# Patient Record
Sex: Female | Born: 1995 | Race: White | Hispanic: No | Smoking: Former smoker
Health system: Southern US, Community
[De-identification: ages and names within clinical notes are randomized; demographics above are authoritative.]

## PROBLEM LIST (undated history)

## (undated) DIAGNOSIS — K2100 Gastro-esophageal reflux disease with esophagitis, without bleeding: Secondary | ICD-10-CM

## (undated) DIAGNOSIS — F99 Mental disorder, not otherwise specified: Secondary | ICD-10-CM

## (undated) DIAGNOSIS — T7422XA Child sexual abuse, confirmed, initial encounter: Secondary | ICD-10-CM

## (undated) DIAGNOSIS — Z3009 Encounter for other general counseling and advice on contraception: Principal | ICD-10-CM

## (undated) DIAGNOSIS — Z30017 Encounter for initial prescription of implantable subdermal contraceptive: Principal | ICD-10-CM

## (undated) DIAGNOSIS — M419 Scoliosis, unspecified: Secondary | ICD-10-CM

## (undated) HISTORY — DX: Gastro-esophageal reflux disease with esophagitis, without bleeding: K21.00

## (undated) HISTORY — DX: Child sexual abuse, confirmed, initial encounter: T74.22XA

## (undated) HISTORY — PX: WISDOM TOOTH EXTRACTION: SHX21

## (undated) HISTORY — DX: Encounter for other general counseling and advice on contraception: Z30.09

## (undated) HISTORY — DX: Scoliosis, unspecified: M41.9

## (undated) HISTORY — DX: Mental disorder, not otherwise specified: F99

## (undated) HISTORY — DX: Encounter for initial prescription of implantable subdermal contraceptive: Z30.017

---
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
## 2017-07-20 ENCOUNTER — Telehealth: Payer: Self-pay | Admitting: Family Medicine

## 2017-07-20 NOTE — Telephone Encounter (Signed)
Pt called stating that [facility] will only evaluate her for ADHD, but stated that they will not prescribe her any medication for it due to it not being their specialty. Please advise.

## 2017-07-20 NOTE — Telephone Encounter (Signed)
Pt is seriously depressed, as that improves with depr therapy it should help her focusing, she needs to definitely tackle this first. I will not rx her any mental health meds(including adhd meds) due to the seriousness of her issues and I have made that very clear to her. She needs to first get her depr under the control.

## 2017-07-20 NOTE — Telephone Encounter (Signed)
Please advise 

## 2017-07-21 ENCOUNTER — Encounter: Payer: Self-pay | Admitting: Advanced Practice Midwife

## 2017-07-21 NOTE — Telephone Encounter (Signed)
No. I will not do that until pt gets on depression therapy and gets rolling with meds, every time she fills out a phq9 she circles for numb 3 including self harm thoughts, see my prior notes
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
## 2017-07-21 NOTE — Telephone Encounter (Signed)
Left message to return call 

## 2017-07-21 NOTE — Telephone Encounter (Signed)
Patient advised that as her deprssion improves with depression therapy it should help her focusing, she needs to definitely tackle this first. Dr Brett CanalesSteve will not rx her any mental health meds(including adhd meds) due to the seriousness of her issues and he has made that very clear to her. She needs to first get her depression under the control. Patient verbalized understanding and stated her depression is better and she wants referral to someone-psych who will prescribe he ADHD meds

## 2017-07-26 NOTE — Telephone Encounter (Signed)
Pt needs long term and ongoing mental health care, I am sorry she didn't like dr Tenny Crawross and so therefore fired her, I am sorry she also doesn't like [facility] and therefor stopped going [facility] , there are vry few choices in our area for m h care. I will not rx her adhd/mental health meds because of her complexity. She needs to get back with [facility]. If she seriously worsens in the meantime, then she should go directly to the ER to be eval urgent;ly by the mental health team

## 2017-07-27 NOTE — Telephone Encounter (Signed)
I called and the number has been changed or disconnected according to the vm. I mailed a green card asking her to call our office.

## 2017-07-28 NOTE — Telephone Encounter (Signed)
Patient # disconnected or has been changed. Green card mailed.07/28/2017

## 2017-08-02 NOTE — Telephone Encounter (Signed)
Phone number we have on file is no longer in service we mailed the patient a card to return our call with no response

## 2017-08-02 NOTE — Telephone Encounter (Signed)
Ok if she ever calls that's our response

## 2017-08-10 ENCOUNTER — Other Ambulatory Visit (HOSPITAL_COMMUNITY): Payer: Self-pay | Admitting: Orthopedic Surgery

## 2017-08-10 DIAGNOSIS — M4120 Other idiopathic scoliosis, site unspecified: Secondary | ICD-10-CM

## 2017-08-10 DIAGNOSIS — M5432 Sciatica, left side: Secondary | ICD-10-CM

## 2017-08-10 DIAGNOSIS — M21372 Foot drop, left foot: Secondary | ICD-10-CM

## 2017-08-11 ENCOUNTER — Telehealth: Payer: Self-pay | Admitting: *Deleted

## 2017-08-11 NOTE — Telephone Encounter (Signed)
pts option to choose to hold off

## 2017-08-11 NOTE — Telephone Encounter (Signed)
See telephone note from 07/20/17. Pt received card in the mail and called back. Discussed message from 07/20/16 with pt and she states she is not worried about the medication now and she does not feel like she needs to be seen by mental health.

## 2017-08-16 ENCOUNTER — Ambulatory Visit (HOSPITAL_COMMUNITY)
Admission: RE | Admit: 2017-08-16 | Discharge: 2017-08-16 | Disposition: A | Discharge status: Home / Self Care | Payer: Medicaid Other | Source: Ambulatory Visit | Attending: Orthopedic Surgery | Admitting: Orthopedic Surgery

## 2017-08-16 DIAGNOSIS — M21372 Foot drop, left foot: Secondary | ICD-10-CM | POA: Diagnosis not present

## 2017-08-16 DIAGNOSIS — M5442 Lumbago with sciatica, left side: Secondary | ICD-10-CM | POA: Diagnosis not present

## 2017-08-16 DIAGNOSIS — R200 Anesthesia of skin: Secondary | ICD-10-CM | POA: Diagnosis not present

## 2017-08-16 DIAGNOSIS — M4686 Other specified inflammatory spondylopathies, lumbar region: Secondary | ICD-10-CM | POA: Diagnosis not present

## 2017-08-16 DIAGNOSIS — M4120 Other idiopathic scoliosis, site unspecified: Secondary | ICD-10-CM

## 2017-08-16 DIAGNOSIS — M4327 Fusion of spine, lumbosacral region: Secondary | ICD-10-CM | POA: Insufficient documentation

## 2017-08-16 DIAGNOSIS — M4186 Other forms of scoliosis, lumbar region: Secondary | ICD-10-CM | POA: Diagnosis not present

## 2017-08-16 DIAGNOSIS — M5432 Sciatica, left side: Secondary | ICD-10-CM

## 2018-05-02 ENCOUNTER — Ambulatory Visit (INDEPENDENT_AMBULATORY_CARE_PROVIDER_SITE_OTHER): Payer: Medicaid Other | Admitting: Family Medicine

## 2018-05-02 ENCOUNTER — Encounter: Payer: Self-pay | Admitting: Family Medicine

## 2018-05-02 VITALS — BP 118/71 | HR 82 | Ht 65.0 in | Wt 140.6 lb

## 2018-05-02 DIAGNOSIS — A749 Chlamydial infection, unspecified: Secondary | ICD-10-CM | POA: Diagnosis not present

## 2018-05-02 DIAGNOSIS — Z0000 Encounter for general adult medical examination without abnormal findings: Secondary | ICD-10-CM

## 2018-05-03 ENCOUNTER — Encounter: Payer: Self-pay | Admitting: Family Medicine

## 2018-05-03 NOTE — Progress Notes (Signed)
**Note Desiree-Identified via Obfuscation**     Annual Physical VISIT ENCOUNTER NOTE  Subjective:   Desiree Harper is a 22 y.o. G0P0000 female here for a a problem GYN visit.  Current complaints: none- desire BCM change. Reports poor appetite with nexplanon and spotting all the time. Denies abnormal vaginal bleeding, discharge, pelvic pain, problems with intercourse or other gynecologic concerns.    Gynecologic History Patient's last menstrual period was 04/25/2018 (exact date). Contraception: Nexplanon Last Pap: NA @23  Last mammogram:  NA  The following portions of the patient's history were reviewed and updated as appropriate: allergies, current medications, past family history, past medical history, past social history, past surgical history and problem list.  Review of Systems Pertinent items are noted in HPI.   Objective:  BP 118/71 (BP Location: Left Arm, Patient Position: Sitting, Cuff Size: Normal)   Pulse 82   Ht 5\' 5"  (1.651 m)   Wt 140 lb 9.6 oz (63.8 kg)   LMP 04/25/2018 (Exact Date)   BMI 23.40 kg/m  Gen: well appearing, NAD HEENT: no scleral icterus CV: RR Lung: Normal WOB Ext: warm well perfused   Assessment and Plan:  1. Annual physical exam - Pap not indicated at this point, discussed starting screening for cervical cancer at 23 and reviewed process of pap.  - Discussed BCM options - Desires LNG-IUD placement and Nexplanon removal- plan to schedule - Reviewed preventative health measures - Discussed immunizations-- recommended flu shot, will consider.  - Currently smoking MJ daily- reviewed potential risks of use with patient in the context of future pregnancies.  - GC/Chlamydia Probe Amp([facility])   Please refer to After Visit Summary for other counseling recommendations.   No follow-ups on file.  Future Appointments  Date Time Provider Department Center  05/10/2018  1:30 PM Jacklyn Shellresenzo-Dishmon, Frances, CNM [facility]     Federico FlakeKimberly Niles Narayan Scull, MD, MPH, ABFM Attending  [facility]

## 2018-05-04 ENCOUNTER — Telehealth: Payer: Self-pay | Admitting: *Deleted

## 2018-05-04 ENCOUNTER — Other Ambulatory Visit: Payer: Medicaid Other | Admitting: Obstetrics and Gynecology

## 2018-05-04 LAB — GC/CHLAMYDIA PROBE AMP
Chlamydia trachomatis, NAA: NEGATIVE
Neisseria gonorrhoeae by PCR: NEGATIVE

## 2018-05-04 MED ORDER — AZITHROMYCIN 500 MG PO TABS
1000.0000 mg | ORAL_TABLET | Freq: Once | ORAL | 1 refills | Status: AC
Start: 2018-05-04 — End: 2018-05-04

## 2018-05-04 NOTE — Telephone Encounter (Signed)
Called patient in error

## 2018-05-04 NOTE — Addendum Note (Signed)
Addended by: Geanie BerlinNEWTON, Kayelee Herbig N on: 05/04/2018 08:02 AM   Modules accepted: Orders

## 2018-05-05 ENCOUNTER — Telehealth: Payer: Self-pay | Admitting: Advanced Practice Midwife

## 2018-05-05 ENCOUNTER — Telehealth: Payer: Self-pay | Admitting: *Deleted

## 2018-05-05 NOTE — Telephone Encounter (Signed)
Patient informed gc/chl negative.  

## 2018-05-05 NOTE — Telephone Encounter (Signed)
Returned patient's call.  Mailbox full.  

## 2018-05-05 NOTE — Telephone Encounter (Signed)
Patient called stating that she would like a call back from the nurse regarding her blood work. Please contact pt

## 2018-05-10 ENCOUNTER — Ambulatory Visit (INDEPENDENT_AMBULATORY_CARE_PROVIDER_SITE_OTHER): Payer: Medicaid Other | Admitting: Advanced Practice Midwife

## 2018-05-10 ENCOUNTER — Encounter: Payer: Self-pay | Admitting: Advanced Practice Midwife

## 2018-05-10 ENCOUNTER — Encounter (INDEPENDENT_AMBULATORY_CARE_PROVIDER_SITE_OTHER): Payer: Self-pay

## 2018-05-10 ENCOUNTER — Other Ambulatory Visit: Payer: Self-pay

## 2018-05-10 VITALS — BP 122/71 | HR 90 | Ht 67.0 in | Wt 138.0 lb

## 2018-05-10 DIAGNOSIS — Z3049 Encounter for surveillance of other contraceptives: Secondary | ICD-10-CM | POA: Diagnosis not present

## 2018-05-10 DIAGNOSIS — Z30430 Encounter for insertion of intrauterine contraceptive device: Secondary | ICD-10-CM

## 2018-05-10 DIAGNOSIS — Z3046 Encounter for surveillance of implantable subdermal contraceptive: Secondary | ICD-10-CM

## 2018-05-10 MED ORDER — LEVONORGESTREL 19.5 MCG/DAY IU IUD
INTRAUTERINE_SYSTEM | Freq: Once | INTRAUTERINE | Status: AC
Start: 2018-05-10 — End: 2018-05-10
  Administered 2018-05-10: 14:00:00 via INTRAUTERINE

## 2018-05-10 NOTE — Patient Instructions (Signed)
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
## 2018-06-07 ENCOUNTER — Encounter: Payer: Self-pay | Admitting: Advanced Practice Midwife

## 2018-06-07 ENCOUNTER — Ambulatory Visit (INDEPENDENT_AMBULATORY_CARE_PROVIDER_SITE_OTHER): Payer: Medicaid Other | Admitting: Advanced Practice Midwife

## 2018-06-07 VITALS — BP 111/67 | HR 80 | Ht 65.0 in | Wt 138.0 lb

## 2018-06-07 DIAGNOSIS — Z30431 Encounter for routine checking of intrauterine contraceptive device: Secondary | ICD-10-CM

## 2018-06-07 NOTE — Progress Notes (Signed)
History:  22 y.o. G0P0000 here today for today for IUD string check; Liletta IUD was placed  A month ago. No complaints about the IUD, no concerning side effects. Having cramps but not concerned The following portions of the patient's history were reviewed and updated as appropriate: allergies, current medications, past family history, past medical history, past social history, past surgical history and problem list.  Review of Systems:   Constitutional: Negative for fever and chills Eyes: Negative for visual disturbances Respiratory: Negative for shortness of breath, dyspnea Cardiovascular: Negative for chest pain or palpitations  Gastrointestinal: Negative for vomiting, diarrhea and constipation Genitourinary: Negative for dysuria and urgency Musculoskeletal: Negative for back pain, joint pain, myalgias  Neurological: Negative for dizziness and headaches    Objective:  Physical Exam Blood pressure 111/67, pulse 80, height 5\' 5"  (1.651 m), weight 138 lb (62.6 kg), last menstrual period 05/10/2018. Gen: NAD Abd: Soft, nontender and nondistended Pelvic: Bedside US reveals properly place IUD. Strings visible.   Assessment & Plan:  Normal IUD check. Patient to keep IUD in place for 6 years; can come in for removal if she desires pregnancy.

## 2018-09-06 ENCOUNTER — Encounter: Payer: Self-pay | Admitting: Family Medicine

## 2018-10-05 DIAGNOSIS — Z029 Encounter for administrative examinations, unspecified: Secondary | ICD-10-CM

## 2019-06-09 ENCOUNTER — Telehealth: Payer: Self-pay | Admitting: Family Medicine

## 2019-06-09 DIAGNOSIS — F321 Major depressive disorder, single episode, moderate: Secondary | ICD-10-CM

## 2019-06-09 DIAGNOSIS — F411 Generalized anxiety disorder: Secondary | ICD-10-CM

## 2019-06-09 DIAGNOSIS — F4310 Post-traumatic stress disorder, unspecified: Secondary | ICD-10-CM

## 2019-06-09 NOTE — Telephone Encounter (Signed)
Patient is requesting a referral to mental health. Last seen 07/31/2016.Please advise

## 2019-06-09 NOTE — Telephone Encounter (Signed)
M h referral

## 2019-06-09 NOTE — Telephone Encounter (Signed)
Pt states she is wanting to be referred to mental health specialist for PTSD, depression and anxiety. Pt states she has been diagnosed with these all from PCP. Pt states she would like to see someone who is not going to be rude to her like her other therapist. Please advise. Thank you

## 2019-06-09 NOTE — Telephone Encounter (Signed)
Referral placed and pt is aware. 

## 2019-06-09 NOTE — Telephone Encounter (Signed)
Please advise. Thank you

## 2019-06-09 NOTE — Telephone Encounter (Signed)
Ok need a more info than that plz

## 2019-06-13 ENCOUNTER — Encounter: Payer: Self-pay | Admitting: Family Medicine

## 2019-07-05 ENCOUNTER — Other Ambulatory Visit: Payer: Self-pay

## 2019-07-05 ENCOUNTER — Emergency Department (HOSPITAL_COMMUNITY)
Admission: EM | Admit: 2019-07-05 | Discharge: 2019-07-05 | Disposition: A | Discharge status: Home / Self Care | Payer: Medicaid Other | Attending: Emergency Medicine | Admitting: Emergency Medicine

## 2019-07-05 ENCOUNTER — Encounter (HOSPITAL_COMMUNITY): Payer: Self-pay | Admitting: Emergency Medicine

## 2019-07-05 DIAGNOSIS — K0889 Other specified disorders of teeth and supporting structures: Secondary | ICD-10-CM | POA: Diagnosis present

## 2019-07-05 DIAGNOSIS — Z975 Presence of (intrauterine) contraceptive device: Secondary | ICD-10-CM | POA: Diagnosis not present

## 2019-07-05 DIAGNOSIS — K047 Periapical abscess without sinus: Secondary | ICD-10-CM | POA: Diagnosis not present

## 2019-07-05 DIAGNOSIS — F17210 Nicotine dependence, cigarettes, uncomplicated: Secondary | ICD-10-CM | POA: Diagnosis not present

## 2019-07-05 MED ORDER — MORPHINE SULFATE (PF) 4 MG/ML IV SOLN
4.0000 mg | Freq: Once | INTRAVENOUS | Status: AC
  Administered 2019-07-05: 4 mg via INTRAVENOUS
  Filled 2019-07-05: qty 1

## 2019-07-05 MED ORDER — KETOROLAC TROMETHAMINE 30 MG/ML IJ SOLN
30.0000 mg | Freq: Once | INTRAMUSCULAR | Status: AC
  Administered 2019-07-05: 30 mg via INTRAVENOUS
  Filled 2019-07-05: qty 1

## 2019-07-05 MED ORDER — CLINDAMYCIN HCL 150 MG PO CAPS: 300.0000 mg | ORAL_CAPSULE | Freq: Three times a day (TID) | ORAL | 0 refills | Status: DC

## 2019-07-05 MED ORDER — CLINDAMYCIN PHOSPHATE 600 MG/50ML IV SOLN
600.0000 mg | Freq: Once | INTRAVENOUS | Status: AC
  Administered 2019-07-05: 21:00:00 600 mg via INTRAVENOUS
  Filled 2019-07-05: qty 50

## 2019-07-05 MED ORDER — SODIUM CHLORIDE 0.9 % IV BOLUS
1000.0000 mL | Freq: Once | INTRAVENOUS | Status: AC
  Administered 2019-07-05: 1000 mL via INTRAVENOUS

## 2019-07-05 NOTE — Discharge Instructions (Signed)
You may follow up with Dr. Sonda Rumble: [phone]  [address] [place], [place] [address]

## 2019-07-05 NOTE — ED Triage Notes (Signed)
Patient states red swollen gums, swollen jaw, and lose tooth on the left bottom side. Patient Patient is taking tylenol ,Amoxicillin, and Ibuprofen prescribed yesterday. Dr. Hyacinth Meeker seeing patient IN TRIAGE.
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
## 2019-07-11 ENCOUNTER — Emergency Department (HOSPITAL_COMMUNITY)
Admission: EM | Admit: 2019-07-11 | Discharge: 2019-07-12 | Disposition: A | Discharge status: Home / Self Care | Payer: Medicaid Other | Attending: Emergency Medicine | Admitting: Emergency Medicine

## 2019-07-11 ENCOUNTER — Other Ambulatory Visit: Payer: Self-pay

## 2019-07-11 ENCOUNTER — Encounter (HOSPITAL_COMMUNITY): Payer: Self-pay | Admitting: *Deleted

## 2019-07-11 DIAGNOSIS — F17210 Nicotine dependence, cigarettes, uncomplicated: Secondary | ICD-10-CM | POA: Diagnosis not present

## 2019-07-11 DIAGNOSIS — R6884 Jaw pain: Secondary | ICD-10-CM | POA: Insufficient documentation

## 2019-07-11 DIAGNOSIS — R590 Localized enlarged lymph nodes: Secondary | ICD-10-CM | POA: Insufficient documentation
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
## 2019-07-11 NOTE — ED Triage Notes (Signed)
Pt c/o left side mouth pain; pt recently had 3 teeth pulled and has been having pain with eating and swallowing; pt states she is having pain to her left jaw line

## 2019-07-12 ENCOUNTER — Emergency Department (HOSPITAL_COMMUNITY): Payer: Medicaid Other

## 2019-07-12 ENCOUNTER — Emergency Department (HOSPITAL_COMMUNITY)
Admission: EM | Admit: 2019-07-12 | Discharge: 2019-07-13 | Disposition: A | Discharge status: Home / Self Care | Payer: Medicaid Other | Source: Home / Self Care | Attending: Emergency Medicine | Admitting: Emergency Medicine

## 2019-07-12 ENCOUNTER — Encounter (HOSPITAL_COMMUNITY): Payer: Self-pay

## 2019-07-12 ENCOUNTER — Other Ambulatory Visit: Payer: Self-pay

## 2019-07-12 DIAGNOSIS — K047 Periapical abscess without sinus: Secondary | ICD-10-CM

## 2019-07-12 LAB — URINALYSIS, ROUTINE W REFLEX MICROSCOPIC
Bilirubin Urine: NEGATIVE
Glucose, UA: NEGATIVE mg/dL
Hgb urine dipstick: NEGATIVE
Ketones, ur: 80 mg/dL — AB
Leukocytes,Ua: NEGATIVE
Nitrite: NEGATIVE
Protein, ur: NEGATIVE mg/dL
Specific Gravity, Urine: 1.018 (ref 1.005–1.030)
pH: 6 (ref 5.0–8.0)

## 2019-07-12 LAB — CBC WITH DIFFERENTIAL/PLATELET
Abs Immature Granulocytes: 0.06 10*3/uL (ref 0.00–0.07)
Basophils Absolute: 0 10*3/uL (ref 0.0–0.1)
Basophils Relative: 0 %
Eosinophils Absolute: 0 10*3/uL (ref 0.0–0.5)
Eosinophils Relative: 0 %
HCT: 45.2 % (ref 36.0–46.0)
Hemoglobin: 14.9 g/dL (ref 12.0–15.0)
Immature Granulocytes: 0 %
Lymphocytes Relative: 10 %
Lymphs Abs: 1.6 10*3/uL (ref 0.7–4.0)
MCH: 31.1 pg (ref 26.0–34.0)
MCHC: 33 g/dL (ref 30.0–36.0)
MCV: 94.4 fL (ref 80.0–100.0)
Monocytes Absolute: 1 10*3/uL (ref 0.1–1.0)
Monocytes Relative: 6 %
Neutro Abs: 12.8 10*3/uL — ABNORMAL HIGH (ref 1.7–7.7)
Neutrophils Relative %: 84 %
Platelets: 360 10*3/uL (ref 150–400)
RBC: 4.79 MIL/uL (ref 3.87–5.11)
RDW: 13 % (ref 11.5–15.5)
WBC: 15.5 10*3/uL — ABNORMAL HIGH (ref 4.0–10.5)
nRBC: 0 % (ref 0.0–0.2)

## 2019-07-12 LAB — BASIC METABOLIC PANEL
Anion gap: 10 (ref 5–15)
BUN: 6 mg/dL (ref 6–20)
CO2: 24 mmol/L (ref 22–32)
Calcium: 9 mg/dL (ref 8.9–10.3)
Chloride: 102 mmol/L (ref 98–111)
Creatinine, Ser: 0.58 mg/dL (ref 0.44–1.00)
GFR calc Af Amer: >60 mL/min (ref >60)
GFR calc non Af Amer: >60 mL/min (ref >60)
Glucose, Bld: 95 mg/dL (ref 70–99)
Potassium: 3.7 mmol/L (ref 3.5–5.1)
Sodium: 136 mmol/L (ref 135–145)

## 2019-07-12 LAB — POC URINE PREG, ED: Preg Test, Ur: NEGATIVE

## 2019-07-12 MED ORDER — OXYCODONE-ACETAMINOPHEN 5-325 MG PO TABS
1.0000 | ORAL_TABLET | Freq: Once | ORAL | Status: AC
  Administered 2019-07-12: 1 via ORAL
  Filled 2019-07-12: qty 1

## 2019-07-12 MED ORDER — HYDROMORPHONE HCL 1 MG/ML IJ SOLN
1.0000 mg | Freq: Once | INTRAMUSCULAR | Status: AC
  Administered 2019-07-12: 20:00:00 1 mg via INTRAVENOUS
  Filled 2019-07-12: qty 1

## 2019-07-12 MED ORDER — MORPHINE SULFATE (PF) 4 MG/ML IV SOLN
4.0000 mg | Freq: Once | INTRAVENOUS | Status: AC
  Administered 2019-07-12: 17:00:00 4 mg via INTRAVENOUS
  Filled 2019-07-12: qty 1

## 2019-07-12 MED ORDER — BUPIVACAINE HCL (PF) 0.5 % IJ SOLN
10.0000 mL | Freq: Once | INTRAMUSCULAR | Status: AC
  Administered 2019-07-12: 10 mL
  Filled 2019-07-12: qty 30

## 2019-07-12 MED ORDER — HYDROMORPHONE HCL 1 MG/ML IJ SOLN
0.5000 mg | Freq: Once | INTRAMUSCULAR | Status: AC
  Administered 2019-07-12: 21:00:00 0.5 mg via INTRAVENOUS
  Filled 2019-07-12: qty 1

## 2019-07-12 MED ORDER — HYDROCODONE-ACETAMINOPHEN 5-325 MG PO TABS: 1.0000 | ORAL_TABLET | ORAL | 0 refills | Status: DC | PRN

## 2019-07-12 MED ORDER — IOHEXOL 300 MG/ML  SOLN
75.0000 mL | Freq: Once | INTRAMUSCULAR | Status: AC | PRN
  Administered 2019-07-12: 19:00:00 75 mL via INTRAVENOUS

## 2019-07-12 MED ORDER — CLINDAMYCIN PHOSPHATE 600 MG/50ML IV SOLN
600.0000 mg | Freq: Once | INTRAVENOUS | Status: AC
  Administered 2019-07-12: 21:00:00 600 mg via INTRAVENOUS
  Filled 2019-07-12: qty 50

## 2019-07-12 NOTE — Discharge Instructions (Addendum)
Continue warm water rinses to your mouth.  Take the hydrocodone as directed given from your previous visit.  Continue taking your Clindamycin and follow-up with your dentist tomorrow for recheck.  Return here if your develop any worsening symptoms
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
## 2019-07-12 NOTE — Discharge Instructions (Addendum)
Continue taking the clindamycin as directed until its finished.  Warm salt water rinses to your mouth.  Follow-up with your dentist or return here in 1 to 2 days if your swelling is not improving or your symptoms worsen.  Continue taking ibuprofen 800 mg 3 times a day with food.

## 2019-07-12 NOTE — ED Notes (Signed)
Pt given pre-pack of vicodin and instructed on how to administer; pt verbalized understanding

## 2019-07-12 NOTE — ED Provider Notes (Signed)
INCISION AND DRAINAGE Performed by: Langston Masker Consent: Verbal consent obtained. Risks and benefits: risks, benefits and alternatives were discussed Type: abscess  Body area: mouth   Anesthesia: local infiltration  Incision was made with a scalpel.  Local anesthetic: marcaine  Anesthetic total: 2 ml  Complexity: complex Blunt dissection to break up loculations  Drainage: purulent  Drainage amount: large   Packing material:   Patient tolerance: Patient tolerated the procedure well with no immediate complications.  Pt has swelling at extraction site that extends toward lower mouth.  Dental block with partial anesthesia. Small incision mid gumline at extraction site.  Large amount of foul smelling drainage.     Osie Cheeks 07/12/19 2218    Vanetta Mulders, MD 07/14/19 [phone]

## 2019-07-12 NOTE — ED Triage Notes (Signed)
Pt seen here last night for swelling of her left jaw line. Was given a Percocet, but nothing else. Extensive swelling to left neck. Has been taking Clindamycin and Amoxicillin with no relief.

## 2019-07-13 MED FILL — Hydrocodone-Acetaminophen Tab 5-325 MG: ORAL | Qty: 6 | Status: AC

## 2019-07-19 ENCOUNTER — Encounter: Payer: Self-pay | Admitting: Family Medicine

## 2019-07-20 ENCOUNTER — Encounter: Payer: Self-pay | Admitting: Family Medicine
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
## 2019-09-21 ENCOUNTER — Other Ambulatory Visit: Payer: Self-pay

## 2019-09-21 ENCOUNTER — Encounter (HOSPITAL_COMMUNITY): Payer: Self-pay | Admitting: Psychiatry

## 2019-09-21 ENCOUNTER — Ambulatory Visit (INDEPENDENT_AMBULATORY_CARE_PROVIDER_SITE_OTHER): Payer: Medicaid Other | Admitting: Psychiatry

## 2019-09-21 DIAGNOSIS — F4310 Post-traumatic stress disorder, unspecified: Secondary | ICD-10-CM

## 2019-09-21 DIAGNOSIS — F331 Major depressive disorder, recurrent, moderate: Secondary | ICD-10-CM

## 2019-09-21 MED ORDER — ESCITALOPRAM OXALATE 10 MG PO TABS: ORAL_TABLET | ORAL | 1 refills | Status: DC

## 2019-09-21 NOTE — Patient Instructions (Signed)
1. Start lexapro 5 mg daily for one week, then 10 mg daily 2. Referral to therapy, /Ms. Bynum if possible 3. Next appointment: 5/7 at 11 AM
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
## 2019-10-27 ENCOUNTER — Other Ambulatory Visit: Payer: Self-pay

## 2019-10-27 ENCOUNTER — Telehealth (HOSPITAL_COMMUNITY): Payer: Medicaid Other | Admitting: Psychiatry

## 2019-10-27 ENCOUNTER — Telehealth (HOSPITAL_COMMUNITY): Payer: Self-pay | Admitting: Psychiatry

## 2019-10-27 NOTE — Telephone Encounter (Signed)
Sent link for video visit through Epic. Patient did not sign in. Called the patient for appointment scheduled today. There is an automatic message stating that the number cannot be completed as dialed. No option to leave a voice message.
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
## 2019-11-02 ENCOUNTER — Telehealth (INDEPENDENT_AMBULATORY_CARE_PROVIDER_SITE_OTHER): Payer: Medicaid Other | Admitting: Psychiatry

## 2019-11-02 ENCOUNTER — Other Ambulatory Visit: Payer: Self-pay

## 2019-11-02 ENCOUNTER — Encounter (HOSPITAL_COMMUNITY): Payer: Self-pay | Admitting: Psychiatry

## 2019-11-02 DIAGNOSIS — F321 Major depressive disorder, single episode, moderate: Secondary | ICD-10-CM

## 2019-11-02 MED ORDER — VENLAFAXINE HCL ER 75 MG PO CP24: ORAL_CAPSULE | ORAL | 1 refills | Status: DC

## 2019-11-02 MED ORDER — VENLAFAXINE HCL ER 37.5 MG PO CP24: 37.5000 mg | ORAL_CAPSULE | Freq: Every day | ORAL | 0 refills | Status: DC

## 2019-11-02 NOTE — Patient Instructions (Signed)
1. Start venlafaxine 37.5 mg daily for one week, then 75 mg daily  2. Next appointment: 6/28 at 4 PM for 30 mins, video 3. Check lab (TSH)
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
## 2019-12-18 ENCOUNTER — Telehealth (HOSPITAL_COMMUNITY): Payer: Medicaid Other | Admitting: Psychiatry

## 2019-12-18 ENCOUNTER — Encounter (HOSPITAL_COMMUNITY): Payer: Self-pay | Admitting: Psychiatry

## 2019-12-18 ENCOUNTER — Other Ambulatory Visit: Payer: Self-pay

## 2019-12-18 ENCOUNTER — Telehealth (INDEPENDENT_AMBULATORY_CARE_PROVIDER_SITE_OTHER): Payer: Medicaid Other | Admitting: Psychiatry

## 2019-12-18 DIAGNOSIS — F321 Major depressive disorder, single episode, moderate: Secondary | ICD-10-CM | POA: Diagnosis not present

## 2019-12-18 DIAGNOSIS — F4310 Post-traumatic stress disorder, unspecified: Secondary | ICD-10-CM

## 2019-12-18 MED ORDER — VENLAFAXINE HCL ER 150 MG PO CP24: 150.0000 mg | ORAL_CAPSULE | Freq: Every day | ORAL | 1 refills | Status: DC

## 2019-12-18 NOTE — Patient Instructions (Signed)
1. Increase venlafaxine 150 mg daily  2. Next appointment: 8/9 at 10:30
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
## 2020-01-29 ENCOUNTER — Encounter (HOSPITAL_COMMUNITY): Payer: Self-pay | Admitting: Psychiatry

## 2020-01-29 ENCOUNTER — Other Ambulatory Visit: Payer: Self-pay

## 2020-01-29 ENCOUNTER — Telehealth (INDEPENDENT_AMBULATORY_CARE_PROVIDER_SITE_OTHER): Payer: Medicaid Other | Admitting: Psychiatry

## 2020-01-29 DIAGNOSIS — F320 Major depressive disorder, single episode, mild: Secondary | ICD-10-CM | POA: Diagnosis not present

## 2020-01-29 DIAGNOSIS — F4310 Post-traumatic stress disorder, unspecified: Secondary | ICD-10-CM

## 2020-01-29 MED ORDER — VENLAFAXINE HCL ER 150 MG PO CP24: 150.0000 mg | ORAL_CAPSULE | Freq: Every day | ORAL | 0 refills | Status: DC

## 2020-01-29 NOTE — Patient Instructions (Signed)
1. Continue venlafaxine 150 mg daily  2.Next appointment:10/25 at 1 PM
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
## 2020-04-15 ENCOUNTER — Telehealth (HOSPITAL_COMMUNITY): Payer: Medicaid Other | Admitting: Psychiatry

## 2020-04-15 ENCOUNTER — Other Ambulatory Visit: Payer: Self-pay

## 2020-04-15 ENCOUNTER — Telehealth (HOSPITAL_COMMUNITY): Payer: Self-pay | Admitting: Psychiatry

## 2020-04-15 NOTE — Telephone Encounter (Signed)
She states that she will not be able to attend to this appointment as she needs to make it to the other appointment. She is advised to contact the office for rescheduling.

## 2021-03-11 IMAGING — CT CT NECK W/ CM
3 of 5 series · 12 of 33 positions shown, 14 images · IV contrast (Omnipaque or Isovue)
Comparison: None.

CLINICAL DATA: Left facial swelling

EXAM:
CT NECK WITH CONTRAST
TECHNIQUE: Multidetector CT imaging of the neck was performed using the
standard protocol following the bolus administration of intravenous
contrast.
CONTRAST:  75mL OMNIPAQUE IOHEXOL 300 MG/ML  SOLN

[Series 4: cor neck · coronal · 0.33mm/px · 3 of 103 slices shown]
[im 30/103  bone]
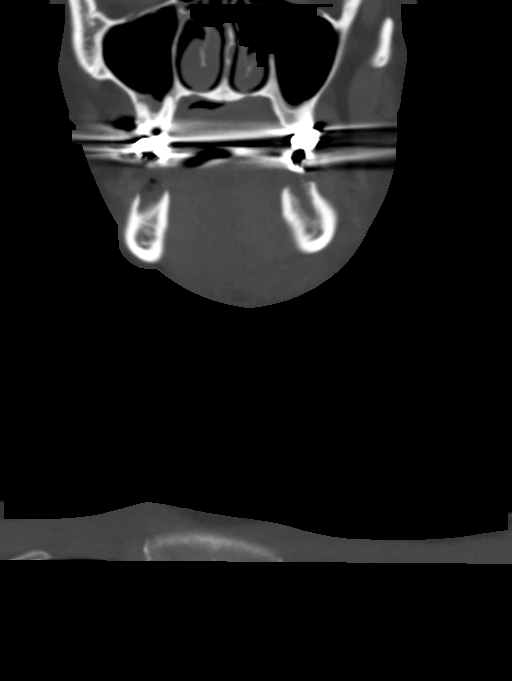
[im 44/103  bone]
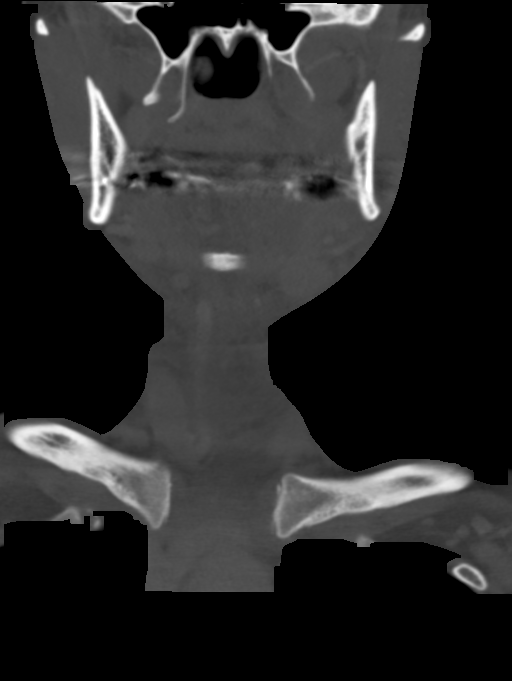
[im 59/103  bone]
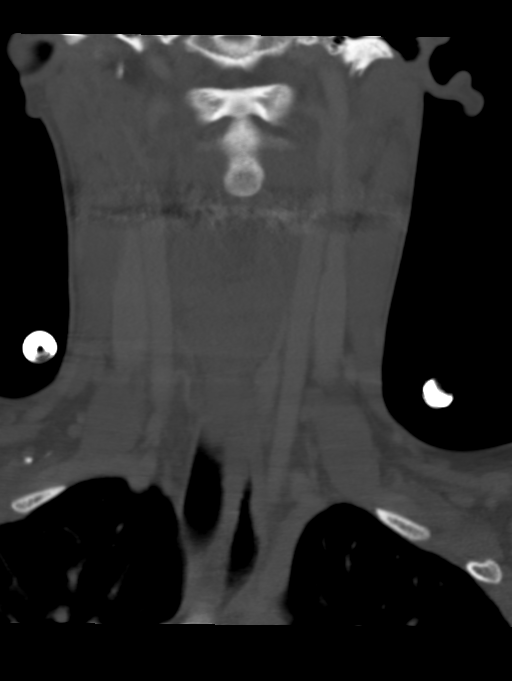

[Series 5: sag neck · sagittal · 0.37mm/px · 5 of 95 slices shown, 6 images]
[im 32/95  bone]
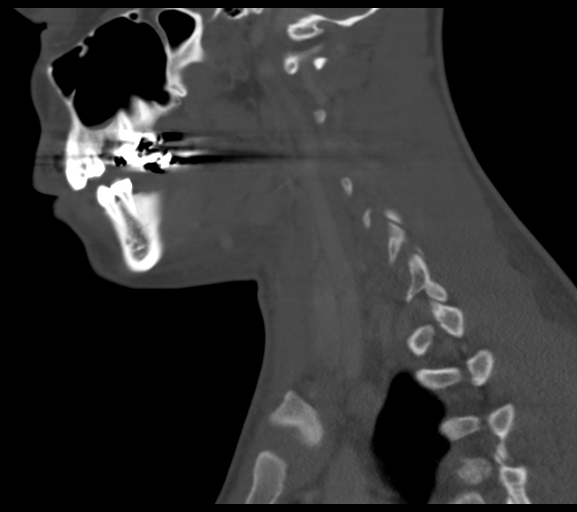
[im 40/95  bone]
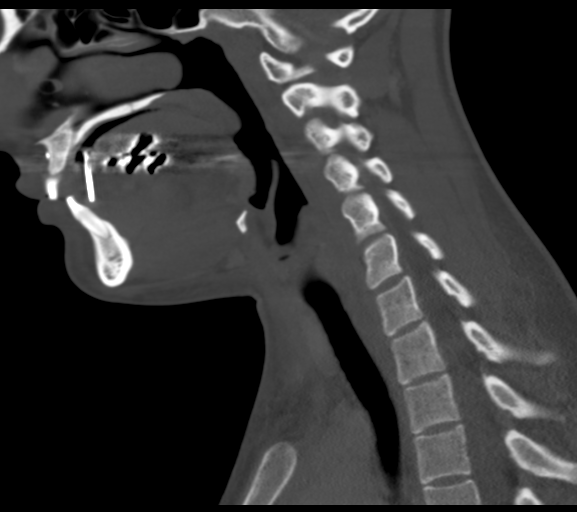
[im 48/95  soft-tissue]
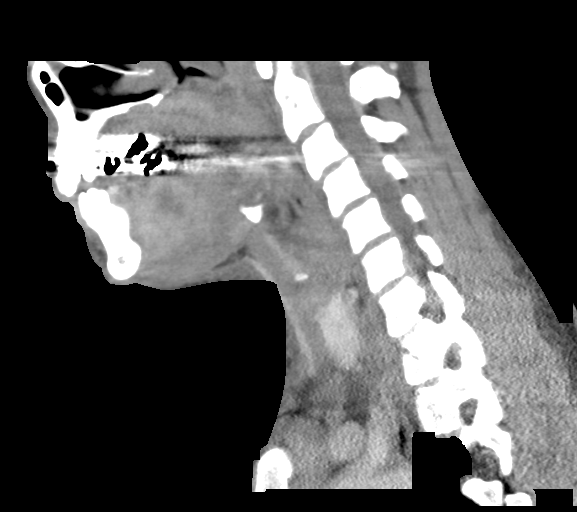
[im 48/95  bone]
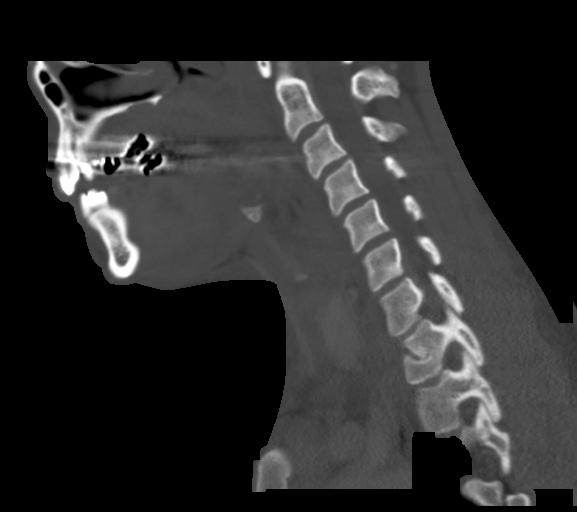
[im 55/95  bone]
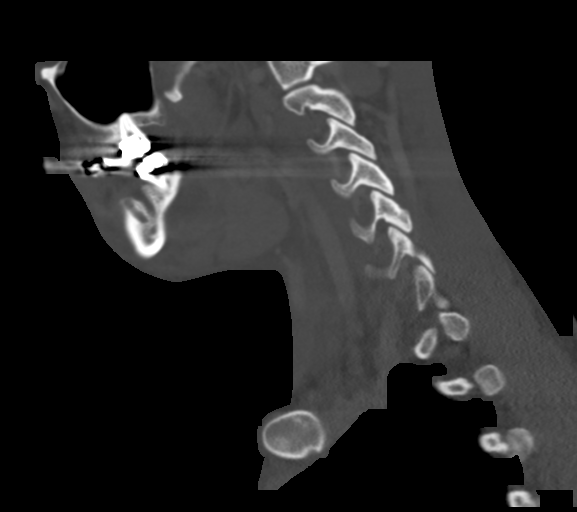
[im 63/95  bone]
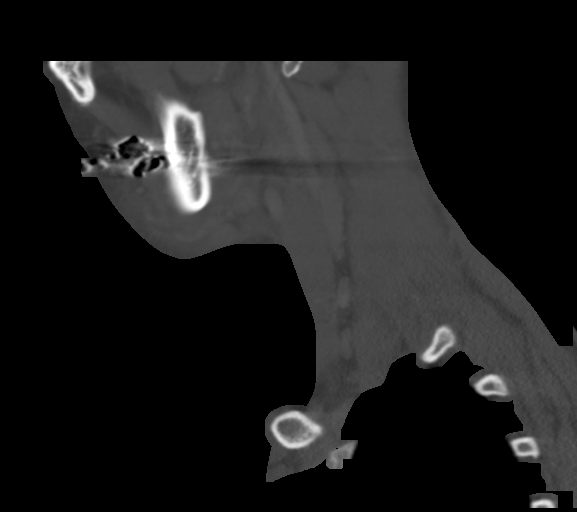

[Series 6: ax oropharynx · axial · 0.31mm/px · z∈[+1423,+1557]mm · 4 of 106 slices shown, 5 images]
[im 18/106  soft-tissue]
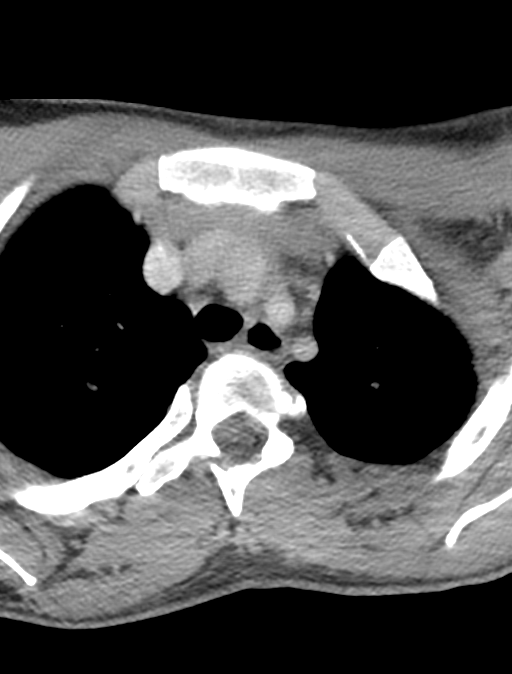
[im 18/106  bone]
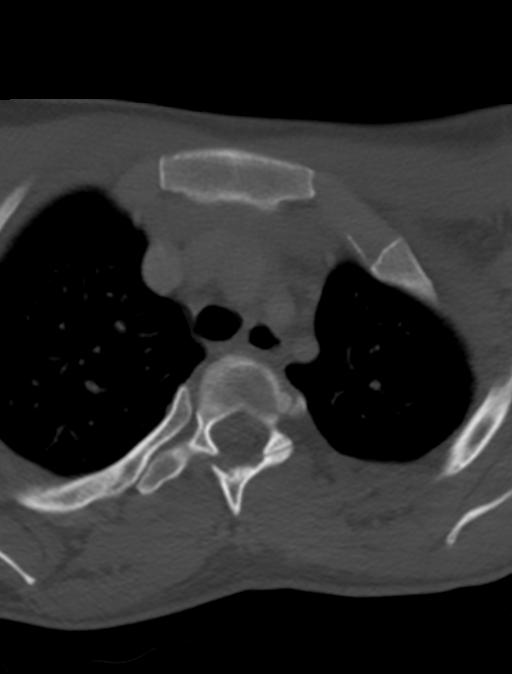
[im 36/106  bone]
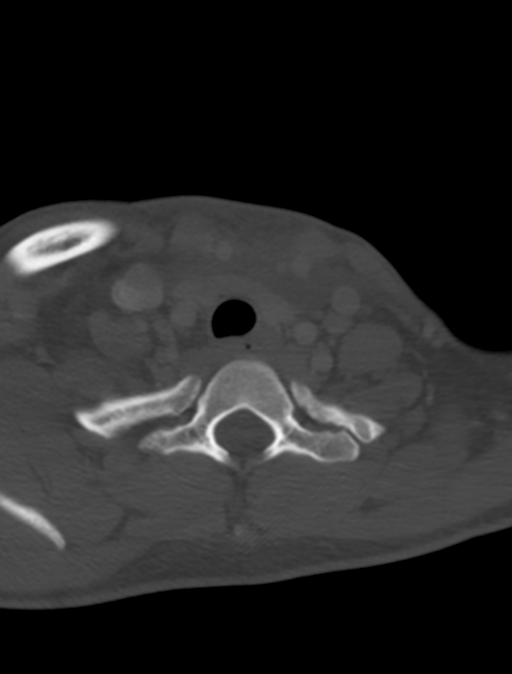
[im 71/106  bone]
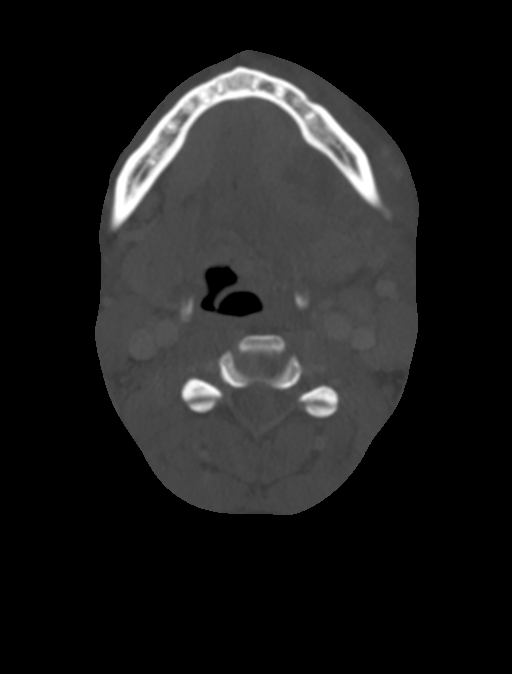
[im 88/106  bone]
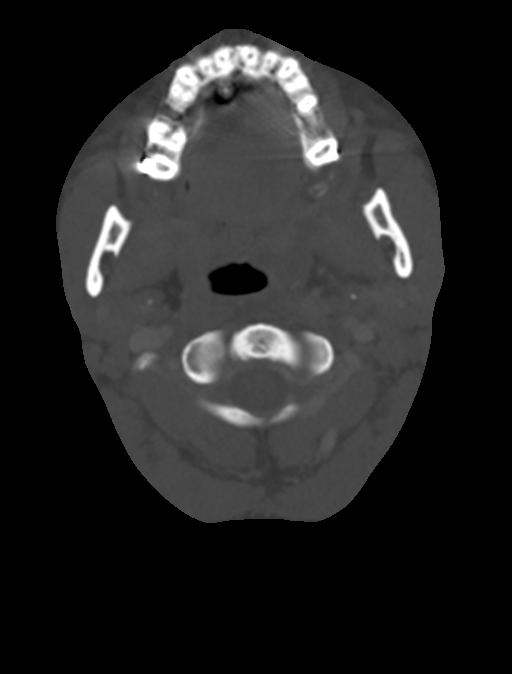

[12 of 33 positions shown; findings below may reference images not displayed]

FINDINGS: PHARYNX AND LARYNX:

--Nasopharynx: Fossae of [place] are clear. Normal adenoid
tonsils for age.

--Oral cavity and oropharynx: The patient appears to have undergone
recent extraction of teeth 19 and 30. Along the medial surface of
the left mandible, near the tooth 19 root, there is a large amount
of inflammation that extends into the superficial left face and the
retromolar trigone. There is a left floor of mouth fluid collection
measuring 19 x 12 x 20 mm. There is rightward displacement of the
tongue and sublingual gland. There is thickening of the left
platysma muscle. Palatine tonsils are normal.

--Hypopharynx: Normal vallecula and pyriform sinuses.

--Larynx: Normal epiglottis and pre-epiglottic space. Normal
aryepiglottic and vocal folds.

--Retropharyngeal space: No abscess, effusion or lymphadenopathy.

SALIVARY GLANDS:

--Parotid: No mass lesion or inflammation. No sialolithiasis or
ductal dilatation.

--Submandibular: Right submandibular gland is normal. Left
submandibular gland is enlarged, likely reactive to surrounding
inflammation.

--Sublingual: Normal

THYROID: Normal.

LYMPH NODES: Reactive left level 1B nodes measure up to 11 mm.

VASCULAR: Major cervical vessels are patent.

LIMITED INTRACRANIAL: Normal.

VISUALIZED ORBITS: Normal.

MASTOIDS AND VISUALIZED PARANASAL SINUSES: No fluid levels or
advanced mucosal thickening. No mastoid effusion.

SKELETON: No bony spinal canal stenosis. No lytic or blastic
lesions.

UPPER CHEST: Clear.

OTHER: None.
IMPRESSION: 1. Left floor of mouth abscess measuring 19 x 12 x 20 mm with
extensive surrounding inflammation extending into the superficial
left face and retromolar trigone. Suspected source is the socket of
the extracted left first mandibular molar.
2. Reactive left level 1B lymphadenopathy and enlarged left
submandibular gland.

## 2022-01-27 ENCOUNTER — Encounter: Payer: Self-pay | Admitting: Obstetrics & Gynecology

## 2022-01-27 ENCOUNTER — Ambulatory Visit (INDEPENDENT_AMBULATORY_CARE_PROVIDER_SITE_OTHER): Payer: Medicare HMO | Admitting: Obstetrics & Gynecology

## 2022-01-27 ENCOUNTER — Other Ambulatory Visit (HOSPITAL_COMMUNITY)
Admission: RE | Admit: 2022-01-27 | Discharge: 2022-01-27 | Disposition: A | Discharge status: Home / Self Care | Payer: Medicare HMO | Source: Ambulatory Visit | Attending: Obstetrics & Gynecology | Admitting: Obstetrics & Gynecology

## 2022-01-27 VITALS — BP 127/84 | HR 93 | Ht 66.0 in | Wt 139.2 lb

## 2022-01-27 DIAGNOSIS — Z30432 Encounter for removal of intrauterine contraceptive device: Secondary | ICD-10-CM | POA: Diagnosis not present

## 2022-01-27 DIAGNOSIS — R102 Pelvic and perineal pain: Secondary | ICD-10-CM | POA: Diagnosis not present

## 2022-01-27 DIAGNOSIS — Z124 Encounter for screening for malignant neoplasm of cervix: Secondary | ICD-10-CM | POA: Diagnosis present

## 2022-01-27 DIAGNOSIS — Z3009 Encounter for other general counseling and advice on contraception: Secondary | ICD-10-CM | POA: Diagnosis not present

## 2022-01-27 NOTE — Progress Notes (Signed)
   GYN VISIT Patient name: Desiree Harper MRN [id number]  Date of birth: 1995/07/11 Chief Complaint:   IUD removal  History of Present Illness:   Desiree Harper is a 26 y.o. G0P0000 female being seen today for the following concerns:  Family planning: wants IUD removed for several reasons- concerned about pelvic pain during IC and irregular bleeding.  Additional desires pregnancy.  Device placed several years ago  Pt concerned about prior ovarian cyst and "wants to be checked."  Reviewed prior Abd CT scan- reassured pt that small cysts are benign, normal finding.  No further work up indicated at this time.  No LMP recorded. (Menstrual status: IUD).      No data to display           Review of Systems:   Pertinent items are noted in HPI Denies fever/chills, dizziness, headaches, visual disturbances, fatigue, shortness of breath, chest pain, abdominal pain, vomiting, see HPI regarding menses Pertinent History Reviewed:  Reviewed past medical,surgical, social, obstetrical and family history.  Reviewed problem list, medications and allergies. Physical Assessment:   Vitals:   01/27/22 1513  BP: 127/84  Pulse: 93  Weight: 139 lb 3.2 oz (63.1 kg)  Height: 5\' 6"  (1.676 m)  Body mass index is 22.47 kg/m.       Physical Examination:   General appearance: alert, disheveled  Psych: mood appropriate, normal affect  Skin: warm & dry   Cardiovascular: normal heart rate noted  Respiratory: normal respiratory effort, no distress  Abdomen: soft, non-tender, no rebound, no guarding  Pelvic: normal external genitalia, vulva, vagina, cervix visualized with IUD strings, uterus and adnexa  Extremities: no edema   Chaperone:  Dr.    IUD REMOVAL  Time out was performed.  A sterile speculum was placed in the vagina.  The cervix was visualized, and the strings  visible. They were grasped and the Liletta  IUD was easily removed intact without complications. The patient  tolerated the procedure well.   Assessment & Plan:   1) Liletta  IUD removal -no complications -reviewed expectations over the next few days -should pelvic pain continue RTC  2) Family planning -encouraged one regular menses before actively start trying -PNV daily -discussed "healthy you"- avoid EtOH, recreational drugs, tobacco, balanced diet and lots of water  3) Preventive screening -pap collected  No orders of the defined types were placed in this encounter.   No follow-ups on file.   Alfredo Martinez, DO Attending Obstetrician & Gynecologist, [facility] for [facility]

## 2022-01-29 LAB — CYTOLOGY - PAP
Chlamydia: NEGATIVE
Comment: NEGATIVE
Comment: NORMAL
Diagnosis: NEGATIVE
Neisseria Gonorrhea: NEGATIVE

## 2022-06-17 ENCOUNTER — Ambulatory Visit (INDEPENDENT_AMBULATORY_CARE_PROVIDER_SITE_OTHER): Payer: Medicare HMO | Admitting: *Deleted

## 2022-06-17 VITALS — BP 134/87 | HR 70 | Ht 65.0 in | Wt 133.0 lb

## 2022-06-17 DIAGNOSIS — N926 Irregular menstruation, unspecified: Secondary | ICD-10-CM | POA: Diagnosis not present

## 2022-06-17 DIAGNOSIS — Z3201 Encounter for pregnancy test, result positive: Secondary | ICD-10-CM | POA: Diagnosis not present

## 2022-06-17 LAB — POCT URINE PREGNANCY: Preg Test, Ur: POSITIVE — AB

## 2022-06-17 NOTE — Progress Notes (Signed)
   NURSE VISIT- PREGNANCY CONFIRMATION   SUBJECTIVE:  Desiree Harper is a 26 y.o. G1P0000 female at [redacted]w[redacted]d by certain LMP of Patient's last menstrual period was 04/14/2022. Here for pregnancy confirmation.  Home pregnancy test: positive x 1   She reports no complaints.  She is not taking prenatal vitamins.    OBJECTIVE:  BP 134/87 (BP Location: Left Arm, Patient Position: Sitting, Cuff Size: Normal)   Pulse 70   Ht 5\' 5"  (1.651 m)   Wt 133 lb (60.3 kg)   LMP 04/14/2022   BMI 22.13 kg/m   Appears well, in no apparent distress  Results for orders placed or performed in visit on 06/17/22 (from the past 24 hour(s))  POCT urine pregnancy   Collection Time: 06/17/22  2:58 PM  Result Value Ref Range   Preg Test, Ur Positive (A) Negative    ASSESSMENT: Positive pregnancy test, [redacted]w[redacted]d by LMP    PLAN: Schedule for dating ultrasound ASAP Prenatal vitamins: plans to begin OTC ASAP   Nausea medicines: not currently needed   OB packet given: Yes  [redacted]w[redacted]d  06/17/2022 2:58 PM

## 2022-06-22 NOTE — L&D Delivery Note (Addendum)
[facility]N Faculty Practice Delivery Note  Desiree Harper is a 27 y.o. G1P0000 s/p vag delivery at [redacted]w[redacted]d. She was admitted for elective IOL.   ROM: 5h 35m with clear fluid GBS Status: neg Maximum Maternal Temperature: 98.4  Labor Progress: [name] was admitted for eIOL; she had cytotec x 1 followed by Pitocin then AROM prior to progressing to vag delivery after pushing approx 1 hr.  Delivery Date/Time: August 28th, 2024 at 2101 Delivery: Called to room and patient was complete and pushing. Head delivered LOA with mom on her R side. No nuchal cord present. Shoulder and body delivered in usual fashion. Infant with spontaneous cry, placed on mother's abdomen, dried and stimulated. Cord clamped x 2 after 1-minute delay, and cut by FOB. Cord blood drawn. Placenta delivered spontaneously with gentle cord traction. Fundus firm with massage and Pitocin, followed by a gush of blood for which she was given cytotec 400 (po)/400 (pr). Labia, perineum, vagina, and cervix inspected and found to be intact.   Placenta: spont, intact; to L&D Complications: none Lacerations: none EBL: 218cc Analgesia: epidural  Postpartum Planning [x]  message to sent to schedule follow-up  [ ]  wants Nexplanon placement prior to d/c  Infant: boy  APGARs 7/9  3110g (6lb 13.7oz)  [name], CNM  02/17/2023 9:23 PM

## 2022-06-30 ENCOUNTER — Other Ambulatory Visit: Payer: Self-pay | Admitting: Obstetrics & Gynecology

## 2022-06-30 DIAGNOSIS — O3680X0 Pregnancy with inconclusive fetal viability, not applicable or unspecified: Secondary | ICD-10-CM

## 2022-07-01 ENCOUNTER — Other Ambulatory Visit (HOSPITAL_COMMUNITY)
Admission: RE | Admit: 2022-07-01 | Discharge: 2022-07-01 | Disposition: A | Discharge status: Home / Self Care | Payer: Medicare HMO | Source: Ambulatory Visit | Attending: Obstetrics & Gynecology | Admitting: Obstetrics & Gynecology

## 2022-07-01 ENCOUNTER — Encounter: Payer: Self-pay | Admitting: *Deleted

## 2022-07-01 ENCOUNTER — Ambulatory Visit (INDEPENDENT_AMBULATORY_CARE_PROVIDER_SITE_OTHER): Payer: Medicare HMO | Admitting: *Deleted

## 2022-07-01 ENCOUNTER — Ambulatory Visit (INDEPENDENT_AMBULATORY_CARE_PROVIDER_SITE_OTHER): Payer: Medicare HMO

## 2022-07-01 VITALS — BP 123/79 | HR 84 | Wt 126.0 lb

## 2022-07-01 DIAGNOSIS — N939 Abnormal uterine and vaginal bleeding, unspecified: Secondary | ICD-10-CM | POA: Insufficient documentation

## 2022-07-01 DIAGNOSIS — O3680X0 Pregnancy with inconclusive fetal viability, not applicable or unspecified: Secondary | ICD-10-CM | POA: Diagnosis not present

## 2022-07-01 DIAGNOSIS — Z3401 Encounter for supervision of normal first pregnancy, first trimester: Secondary | ICD-10-CM | POA: Diagnosis present

## 2022-07-01 DIAGNOSIS — N898 Other specified noninflammatory disorders of vagina: Secondary | ICD-10-CM | POA: Diagnosis present

## 2022-07-01 DIAGNOSIS — Z3A11 11 weeks gestation of pregnancy: Secondary | ICD-10-CM

## 2022-07-01 DIAGNOSIS — Z113 Encounter for screening for infections with a predominantly sexual mode of transmission: Secondary | ICD-10-CM | POA: Diagnosis present

## 2022-07-01 DIAGNOSIS — Z3400 Encounter for supervision of normal first pregnancy, unspecified trimester: Secondary | ICD-10-CM | POA: Insufficient documentation

## 2022-07-01 MED ORDER — BLOOD PRESSURE MONITOR MISC: 0 refills | Status: DC

## 2022-07-01 NOTE — Progress Notes (Signed)
[place] 7+1 wks,single IUP with yolk sac,CRL 11.02 mm,normal ovaries,FHR 143 bpm
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
## 2022-07-02 ENCOUNTER — Other Ambulatory Visit: Payer: Medicare HMO

## 2022-07-02 LAB — CERVICOVAGINAL ANCILLARY ONLY
Bacterial Vaginitis (gardnerella): NEGATIVE
Candida Glabrata: NEGATIVE
Candida Vaginitis: NEGATIVE
Chlamydia: NEGATIVE
Comment: NEGATIVE
Comment: NEGATIVE
Comment: NEGATIVE
Comment: NEGATIVE
Comment: NEGATIVE
Comment: NORMAL
Neisseria Gonorrhea: NEGATIVE
Trichomonas: NEGATIVE

## 2022-07-15 ENCOUNTER — Telehealth: Payer: Self-pay | Admitting: Advanced Practice Midwife

## 2022-07-15 NOTE — Telephone Encounter (Signed)
Patient states that when she throws up in the morning it is greenish-yellow and wants to know if it's normal. Any other time of day, it is of normal color. Please advise.

## 2022-08-11 ENCOUNTER — Other Ambulatory Visit: Payer: Self-pay | Admitting: Obstetrics & Gynecology

## 2022-08-11 DIAGNOSIS — Z3682 Encounter for antenatal screening for nuchal translucency: Secondary | ICD-10-CM

## 2022-08-12 ENCOUNTER — Ambulatory Visit (INDEPENDENT_AMBULATORY_CARE_PROVIDER_SITE_OTHER): Payer: Medicare HMO | Admitting: Advanced Practice Midwife

## 2022-08-12 ENCOUNTER — Ambulatory Visit (INDEPENDENT_AMBULATORY_CARE_PROVIDER_SITE_OTHER): Payer: Medicare HMO

## 2022-08-12 ENCOUNTER — Encounter: Payer: Self-pay | Admitting: Advanced Practice Midwife

## 2022-08-12 VITALS — BP 118/78 | HR 71 | Wt 138.0 lb

## 2022-08-12 DIAGNOSIS — Z3682 Encounter for antenatal screening for nuchal translucency: Secondary | ICD-10-CM

## 2022-08-12 DIAGNOSIS — Z363 Encounter for antenatal screening for malformations: Secondary | ICD-10-CM

## 2022-08-12 DIAGNOSIS — Z3401 Encounter for supervision of normal first pregnancy, first trimester: Secondary | ICD-10-CM

## 2022-08-12 DIAGNOSIS — F4310 Post-traumatic stress disorder, unspecified: Secondary | ICD-10-CM

## 2022-08-12 DIAGNOSIS — F321 Major depressive disorder, single episode, moderate: Secondary | ICD-10-CM

## 2022-08-12 DIAGNOSIS — M41129 Adolescent idiopathic scoliosis, site unspecified: Secondary | ICD-10-CM | POA: Diagnosis not present

## 2022-08-12 DIAGNOSIS — Z3A13 13 weeks gestation of pregnancy: Secondary | ICD-10-CM | POA: Diagnosis not present

## 2022-08-12 DIAGNOSIS — Z3402 Encounter for supervision of normal first pregnancy, second trimester: Secondary | ICD-10-CM

## 2022-08-12 NOTE — Patient Instructions (Signed)
Desiree Harper, thank you for choosing our office today! We appreciate the opportunity to meet your healthcare needs. You may receive a short survey by mail, e-mail, or through [employer]. If you are happy with your care we would appreciate if you could take just a few minutes to complete the survey questions. We read all of your comments and take your feedback very seriously. Thank you again for choosing our office.  [facility] at [place] at [facility] ([place], [place] [address]) Entrance C, located off of [address] parking   Nausea & Vomiting Have saltine crackers or pretzels by your bed and eat a few bites before you raise your head out of bed in the morning Eat small frequent meals throughout the day instead of large meals Drink plenty of fluids throughout the day to stay hydrated, just don't drink a lot of fluids with your meals.  This can make your stomach fill up faster making you feel sick Do not brush your teeth right after you eat Products with real ginger are good for nausea, like ginger ale and ginger hard candy Make sure it says made with real ginger! Sucking on sour candy like lemon heads is also good for nausea If your prenatal vitamins make you nauseated, take them at night so you will sleep through the nausea Sea Bands If you feel like you need medicine for the nausea & vomiting please let us know If you are unable to keep any fluids or food down please let us know   Constipation Drink plenty of fluid, preferably water, throughout the day Eat foods high in fiber such as fruits, vegetables, and grains Exercise, such as walking, is a good way to keep your bowels regular Drink warm fluids, especially warm prune juice, or decaf coffee Eat a 1/2 cup of real oatmeal (not instant), 1/2 cup applesauce, and 1/2-1 cup warm prune juice every day If needed, you may take Colace (docusate sodium) stool softener  once or twice a day to help keep the stool soft.  If you still are having problems with constipation, you may take Miralax once daily as needed to help keep your bowels regular.   Home Blood Pressure Monitoring for Patients   Your provider has recommended that you check your blood pressure (BP) at least once a week at home. If you do not have a blood pressure cuff at home, one will be provided for you. Contact your provider if you have not received your monitor within 1 week.   Helpful Tips for Accurate Home Blood Pressure Checks  Don't smoke, exercise, or drink caffeine 30 minutes before checking your BP Use the restroom before checking your BP (a full bladder can raise your pressure) Relax in a comfortable upright chair Feet on the ground Left arm resting comfortably on a flat surface at the level of your heart Legs uncrossed Back supported Sit quietly and don't talk Place the cuff on your bare arm Adjust snuggly, so that only two fingertips can fit between your skin and the top of the cuff Check 2 readings separated by at least one minute Keep a log of your BP readings For a visual, please reference this diagram: http://ccnc.care/bpdiagram  Provider Name: [facility]     Phone: [phone]  Zone 1: ALL CLEAR  Continue to monitor your symptoms:  BP reading is less than 140 (top number) or less than 90 (bottom  number)  No right upper stomach pain No headaches or seeing spots No feeling nauseated or throwing up No swelling in face and hands  Zone 2: CAUTION Call your doctor's office for any of the following:  BP reading is greater than 140 (top number) or greater than 90 (bottom number)  Stomach pain under your ribs in the middle or right side Headaches or seeing spots Feeling nauseated or throwing up Swelling in face and hands  Zone 3: EMERGENCY  Seek immediate medical care if you have any of the following:  BP reading is greater than160 (top number) or greater than  110 (bottom number) Severe headaches not improving with Tylenol Serious difficulty catching your breath Any worsening symptoms from Zone 2    First Trimester of Pregnancy The first trimester of pregnancy is from week 1 until the end of week 12 (months 1 through 3). A week after a sperm fertilizes an egg, the egg will implant on the wall of the uterus. This embryo will begin to develop into a baby. Genes from you and your partner are forming the baby. The female genes determine whether the baby is a boy or a girl. At 6-8 weeks, the eyes and face are formed, and the heartbeat can be seen on ultrasound. At the end of 12 weeks, all the baby's organs are formed.  Now that you are pregnant, you will want to do everything you can to have a healthy baby. Two of the most important things are to get good prenatal care and to follow your health care provider's instructions. Prenatal care is all the medical care you receive before the baby's birth. This care will help prevent, find, and treat any problems during the pregnancy and childbirth. BODY CHANGES Your body goes through many changes during pregnancy. The changes vary from woman to woman.  You may gain or lose a couple of pounds at first. You may feel sick to your stomach (nauseous) and throw up (vomit). If the vomiting is uncontrollable, call your health care provider. You may tire easily. You may develop headaches that can be relieved by medicines approved by your health care provider. You may urinate more often. Painful urination may mean you have a bladder infection. You may develop heartburn as a result of your pregnancy. You may develop constipation because certain hormones are causing the muscles that push waste through your intestines to slow down. You may develop hemorrhoids or swollen, bulging veins (varicose veins). Your breasts may begin to grow larger and become tender. Your nipples may stick out more, and the tissue that surrounds them  (areola) may become darker. Your gums may bleed and may be sensitive to brushing and flossing. Dark spots or blotches (chloasma, mask of pregnancy) may develop on your face. This will likely fade after the baby is born. Your menstrual periods will stop. You may have a loss of appetite. You may develop cravings for certain kinds of food. You may have changes in your emotions from day to day, such as being excited to be pregnant or being concerned that something may go wrong with the pregnancy and baby. You may have more vivid and strange dreams. You may have changes in your hair. These can include thickening of your hair, rapid growth, and changes in texture. Some women also have hair loss during or after pregnancy, or hair that feels dry or thin. Your hair will most likely return to normal after your baby is born. WHAT TO EXPECT AT YOUR PRENATAL  VISITS During a routine prenatal visit: You will be weighed to make sure you and the baby are growing normally. Your blood pressure will be taken. Your abdomen will be measured to track your baby's growth. The fetal heartbeat will be listened to starting around week 10 or 12 of your pregnancy. Test results from any previous visits will be discussed. Your health care provider may ask you: How you are feeling. If you are feeling the baby move. If you have had any abnormal symptoms, such as leaking fluid, bleeding, severe headaches, or abdominal cramping. If you have any questions. Other tests that may be performed during your first trimester include: Blood tests to find your blood type and to check for the presence of any previous infections. They will also be used to check for low iron levels (anemia) and Rh antibodies. Later in the pregnancy, blood tests for diabetes will be done along with other tests if problems develop. Urine tests to check for infections, diabetes, or protein in the urine. An ultrasound to confirm the proper growth and development  of the baby. An amniocentesis to check for possible genetic problems. Fetal screens for spina bifida and Down syndrome. You may need other tests to make sure you and the baby are doing well. HOME CARE INSTRUCTIONS  Medicines Follow your health care provider's instructions regarding medicine use. Specific medicines may be either safe or unsafe to take during pregnancy. Take your prenatal vitamins as directed. If you develop constipation, try taking a stool softener if your health care provider approves. Diet Eat regular, well-balanced meals. Choose a variety of foods, such as meat or vegetable-based protein, fish, milk and low-fat dairy products, vegetables, fruits, and whole grain breads and cereals. Your health care provider will help you determine the amount of weight gain that is right for you. Avoid raw meat and uncooked cheese. These carry germs that can cause birth defects in the baby. Eating four or five small meals rather than three large meals a day may help relieve nausea and vomiting. If you start to feel nauseous, eating a few soda crackers can be helpful. Drinking liquids between meals instead of during meals also seems to help nausea and vomiting. If you develop constipation, eat more high-fiber foods, such as fresh vegetables or fruit and whole grains. Drink enough fluids to keep your urine clear or pale yellow. Activity and Exercise Exercise only as directed by your health care provider. Exercising will help you: Control your weight. Stay in shape. Be prepared for labor and delivery. Experiencing pain or cramping in the lower abdomen or low back is a good sign that you should stop exercising. Check with your health care provider before continuing normal exercises. Try to avoid standing for long periods of time. Move your legs often if you must stand in one place for a long time. Avoid heavy lifting. Wear low-heeled shoes, and practice good posture. You may continue to have sex  unless your health care provider directs you otherwise. Relief of Pain or Discomfort Wear a good support bra for breast tenderness.   Take warm sitz baths to soothe any pain or discomfort caused by hemorrhoids. Use hemorrhoid cream if your health care provider approves.   Rest with your legs elevated if you have leg cramps or low back pain. If you develop varicose veins in your legs, wear support hose. Elevate your feet for 15 minutes, 3-4 times a day. Limit salt in your diet. Prenatal Care Schedule your prenatal visits by the  twelfth week of pregnancy. They are usually scheduled monthly at first, then more often in the last 2 months before delivery. Write down your questions. Take them to your prenatal visits. Keep all your prenatal visits as directed by your health care provider. Safety Wear your seat belt at all times when driving. Make a list of emergency phone numbers, including numbers for family, friends, the hospital, and police and fire departments. General Tips Ask your health care provider for a referral to a local prenatal education class. Begin classes no later than at the beginning of month 6 of your pregnancy. Ask for help if you have counseling or nutritional needs during pregnancy. Your health care provider can offer advice or refer you to specialists for help with various needs. Do not use hot tubs, steam rooms, or saunas. Do not douche or use tampons or scented sanitary pads. Do not cross your legs for long periods of time. Avoid cat litter boxes and soil used by cats. These carry germs that can cause birth defects in the baby and possibly loss of the fetus by miscarriage or stillbirth. Avoid all smoking, herbs, alcohol, and medicines not prescribed by your health care provider. Chemicals in these affect the formation and growth of the baby. Schedule a dentist appointment. At home, brush your teeth with a soft toothbrush and be gentle when you floss. SEEK MEDICAL CARE IF:   You have dizziness. You have mild pelvic cramps, pelvic pressure, or nagging pain in the abdominal area. You have persistent nausea, vomiting, or diarrhea. You have a bad smelling vaginal discharge. You have pain with urination. You notice increased swelling in your face, hands, legs, or ankles. SEEK IMMEDIATE MEDICAL CARE IF:  You have a fever. You are leaking fluid from your vagina. You have spotting or bleeding from your vagina. You have severe abdominal cramping or pain. You have rapid weight gain or loss. You vomit blood or material that looks like coffee grounds. You are exposed to [place] measles and have never had them. You are exposed to fifth disease or chickenpox. You develop a severe headache. You have shortness of breath. You have any kind of trauma, such as from a fall or a car accident. Document Released: 06/02/2001 Document Revised: 10/23/2013 Document Reviewed: 04/18/2013 [facility] Patient Information 2015 [place], [place]. This information is not intended to replace advice given to you by your health care provider. Make sure you discuss any questions you have with your health care provider.
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
## 2022-08-12 NOTE — Progress Notes (Signed)
[place] 13+1 wks,measurements c/w dates, FHR 155 bpm,anterior placenta gr 0,normal ovaries,CRL 73.94 mm

## 2022-08-13 ENCOUNTER — Telehealth: Payer: Self-pay | Admitting: Advanced Practice Midwife

## 2022-08-13 LAB — INTEGRATED 1

## 2022-08-13 NOTE — Telephone Encounter (Signed)
Patient is concerned with some of her lab numbers. Her phone service is down, so she will have to be message via mychart. She was questioning about her neutrophils absolute lab result. Please advise.

## 2022-08-14 ENCOUNTER — Encounter: Payer: Self-pay | Admitting: Obstetrics & Gynecology

## 2022-08-14 LAB — CBC/D/PLT+RPR+RH+ABO+RUBIGG...
Antibody Screen: NEGATIVE
Basophils Absolute: 0 10*3/uL (ref 0.0–0.2)
Basos: 0 %
EOS (ABSOLUTE): 0.1 10*3/uL (ref 0.0–0.4)
Eos: 1 %
HCV Ab: NONREACTIVE
HIV Screen 4th Generation wRfx: NONREACTIVE
Hematocrit: 43.1 % (ref 34.0–46.6)
Hemoglobin: 14.1 g/dL (ref 11.1–15.9)
Hepatitis B Surface Ag: NEGATIVE
Immature Grans (Abs): 0.1 10*3/uL (ref 0.0–0.1)
Immature Granulocytes: 1 %
Lymphocytes Absolute: 1.6 10*3/uL (ref 0.7–3.1)
Lymphs: 16 %
MCH: 30.4 pg (ref 26.6–33.0)
MCHC: 32.7 g/dL (ref 31.5–35.7)
MCV: 93 fL (ref 79–97)
Monocytes Absolute: 0.6 10*3/uL (ref 0.1–0.9)
Monocytes: 6 %
Neutrophils Absolute: 7.9 10*3/uL — ABNORMAL HIGH (ref 1.4–7.0)
Neutrophils: 76 %
Platelets: 337 10*3/uL (ref 150–450)
RBC: 4.64 x10E6/uL (ref 3.77–5.28)
RDW: 13.3 % (ref 11.7–15.4)
RPR Ser Ql: NONREACTIVE
Rh Factor: POSITIVE
Rubella Antibodies, IGG: 6.74 index (ref >0.99)
WBC: 10.4 10*3/uL (ref 3.4–10.8)

## 2022-08-14 LAB — INTEGRATED 1
Crown Rump Length: 73.9 mm
Gest. Age on Collection Date: 13.3 weeks
Maternal Age at EDD: 25 yr
Nuchal Translucency (NT): 1.7 mm
Number of Fetuses: 1
PAPP-A Value: 1216 ng/mL
Weight: 138 [lb_av]

## 2022-08-14 LAB — HCV INTERPRETATION

## 2022-08-14 LAB — URINE CULTURE: Organism ID, Bacteria: NO GROWTH

## 2022-08-15 ENCOUNTER — Encounter: Payer: Self-pay | Admitting: Advanced Practice Midwife

## 2022-08-15 DIAGNOSIS — O98219 Gonorrhea complicating pregnancy, unspecified trimester: Secondary | ICD-10-CM

## 2022-08-15 HISTORY — DX: Gonorrhea complicating pregnancy, unspecified trimester: O98.219

## 2022-08-19 LAB — PANORAMA PRENATAL TEST FULL PANEL:PANORAMA TEST PLUS 5 ADDITIONAL MICRODELETIONS: FETAL FRACTION: 7.4

## 2022-08-20 ENCOUNTER — Encounter: Payer: Self-pay | Admitting: *Deleted

## 2022-08-23 LAB — HORIZON CUSTOM: REPORT SUMMARY: NEGATIVE

## 2022-09-01 ENCOUNTER — Encounter: Payer: Self-pay | Admitting: Obstetrics & Gynecology

## 2022-09-09 ENCOUNTER — Ambulatory Visit (INDEPENDENT_AMBULATORY_CARE_PROVIDER_SITE_OTHER): Payer: Medicare HMO | Admitting: Advanced Practice Midwife

## 2022-09-09 VITALS — BP 130/78 | HR 98 | Wt 147.0 lb

## 2022-09-09 DIAGNOSIS — Z3A17 17 weeks gestation of pregnancy: Secondary | ICD-10-CM

## 2022-09-09 DIAGNOSIS — Z1379 Encounter for other screening for genetic and chromosomal anomalies: Secondary | ICD-10-CM

## 2022-09-09 DIAGNOSIS — Z3402 Encounter for supervision of normal first pregnancy, second trimester: Secondary | ICD-10-CM

## 2022-09-09 NOTE — Progress Notes (Signed)
   LOW-RISK PREGNANCY VISIT Patient name: Desiree Harper MRN [id number]  Date of birth: Apr 23, 1996 Chief Complaint:   Routine Prenatal Visit  History of Present Illness:   Desiree Harper is a 27 y.o. G1P0000 female at [redacted]w[redacted]d with an Estimated Date of Delivery: 02/16/23 being seen today for ongoing management of a low-risk pregnancy.  Today she reports no complaints. Contractions: Not present. Vag. Bleeding: None.  Movement: Absent. denies leaking of fluid. Review of Systems:   Pertinent items are noted in HPI Denies abnormal vaginal discharge w/ itching/odor/irritation, headaches, visual changes, shortness of breath, chest pain, abdominal pain, severe nausea/vomiting, or problems with urination or bowel movements unless otherwise stated above. Pertinent History Reviewed:  Reviewed past medical,surgical, social, obstetrical and family history.  Reviewed problem list, medications and allergies. Physical Assessment:   Vitals:   09/09/22 1015 09/09/22 1017  BP: (!) 141/79 130/78  Pulse: (!) 114 98  Weight:  147 lb (66.7 kg)  Body mass index is 24.46 kg/m.        Physical Examination:   General appearance: Well appearing, and in no distress  Mental status: Alert, oriented to person, place, and time  Skin: Warm & dry  Cardiovascular: Normal heart rate noted  Respiratory: Normal respiratory effort, no distress  Abdomen: Soft, gravid, nontender  Pelvic: Cervical exam deferred         Extremities: Edema: None  Fetal Status: Fetal Heart Rate (bpm): 154   Movement: Absent    No results found for this or any previous visit (from the past 24 hour(s)).  Assessment & Plan:  1) Low-risk pregnancy G1P0000 at [redacted]w[redacted]d with an Estimated Date of Delivery: 02/16/23   2) Had +GC in urine on 08/12/22, this was very distressing to pt/partner; had retest at [facility] 08/21/22 with neg results (under media); didn't take treatment; will check again @ 36wks per protocol    Meds: No orders of the defined  types were placed in this encounter.  Labs/procedures today: 2nd IT  Plan:  Continue routine obstetrical care   Reviewed: Preterm labor symptoms and general obstetric precautions including but not limited to vaginal bleeding, contractions, leaking of fluid and fetal movement were reviewed in detail with the patient.  All questions were answered. Has home bp cuff. Check bp weekly, let us know if >140/90.   Follow-up: Return for As scheduled. (Anatomy u/s)  Orders Placed This Encounter  Procedures   INTEGRATED 2   Myrtis Ser [facility] 09/09/2022 10:29 AM

## 2022-09-13 LAB — INTEGRATED 2
AFP MoM: 1.1
Alpha-Fetoprotein: 44 ng/mL
Crown Rump Length: 73.9 mm
DIA MoM: 0.81
DIA Value: 129.1 pg/mL
Estriol, Unconjugated: 1.18 ng/mL
Gest. Age on Collection Date: 13.3 weeks
Gestational Age: 17.3 weeks
Maternal Age at EDD: 25 yr
Nuchal Translucency (NT): 1.7 mm
Nuchal Translucency MoM: 1
Number of Fetuses: 1
PAPP-A MoM: 0.82
PAPP-A Value: 1216 ng/mL
Test Results:: NEGATIVE
Weight: 138 [lb_av]
Weight: 138 [lb_av]
hCG MoM: 0.58
hCG Value: 17.6 IU/mL
uE3 MoM: 0.92

## 2022-09-17 ENCOUNTER — Encounter: Payer: Self-pay | Admitting: Obstetrics & Gynecology

## 2022-09-23 ENCOUNTER — Encounter: Payer: Self-pay | Admitting: Advanced Practice Midwife

## 2022-09-23 ENCOUNTER — Other Ambulatory Visit (INDEPENDENT_AMBULATORY_CARE_PROVIDER_SITE_OTHER): Payer: Medicare HMO

## 2022-09-23 ENCOUNTER — Ambulatory Visit (INDEPENDENT_AMBULATORY_CARE_PROVIDER_SITE_OTHER): Payer: Medicare HMO | Admitting: Advanced Practice Midwife

## 2022-09-23 VITALS — BP 110/71 | HR 82 | Wt 151.0 lb

## 2022-09-23 DIAGNOSIS — Z3402 Encounter for supervision of normal first pregnancy, second trimester: Secondary | ICD-10-CM

## 2022-09-23 DIAGNOSIS — Z363 Encounter for antenatal screening for malformations: Secondary | ICD-10-CM

## 2022-09-23 DIAGNOSIS — Z3A19 19 weeks gestation of pregnancy: Secondary | ICD-10-CM

## 2022-09-23 DIAGNOSIS — O98212 Gonorrhea complicating pregnancy, second trimester: Secondary | ICD-10-CM

## 2022-09-23 NOTE — Progress Notes (Signed)
   LOW-RISK PREGNANCY VISIT Patient name: Desiree Harper MRN [id number]  Date of birth: 26-Oct-1995 Chief Complaint:   Routine Prenatal Visit  History of Present Illness:   Desiree Harper is a 27 y.o. G1P0000 female at [redacted]w[redacted]d with an Estimated Date of Delivery: 02/16/23 being seen today for ongoing management of a low-risk pregnancy.  Today she reports  having some RLP last night . Contractions: Not present.  .  Movement: Present. denies leaking of fluid. Review of Systems:   Pertinent items are noted in HPI Denies abnormal vaginal discharge w/ itching/odor/irritation, headaches, visual changes, shortness of breath, chest pain, abdominal pain, severe nausea/vomiting, or problems with urination or bowel movements unless otherwise stated above. Pertinent History Reviewed:  Reviewed past medical,surgical, social, obstetrical and family history.  Reviewed problem list, medications and allergies. Physical Assessment:   Vitals:   09/23/22 1533  BP: 110/71  Pulse: 82  Weight: 151 lb (68.5 kg)  Body mass index is 25.13 kg/m.        Physical Examination:   General appearance: Well appearing, and in no distress  Mental status: Alert, oriented to person, place, and time  Skin: Warm & dry  Cardiovascular: Normal heart rate noted  Respiratory: Normal respiratory effort, no distress  Abdomen: Soft, gravid, nontender  Pelvic: Cervical exam deferred         Extremities: Edema: None  Fetal Status: Fetal Heart Rate (bpm): 140 u/s   Movement: Present    Anatomy u/s: [place] [id number] wks,cephalic,cx 3.6 cm,anterior placenta gr 0,SVP of fluid 4.5 cm,FHR 140 bpm,EFW 307 g 76%,anatomy complete,no obvious abnormalities   No results found for this or any previous visit (from the past 24 hour(s)).  Assessment & Plan:  1) Low-risk pregnancy G1P0000 at [redacted]w[redacted]d with an Estimated Date of Delivery: 02/16/23     Meds: No orders of the defined types were placed in this encounter.  Labs/procedures today: anatomy  u/s  Plan:  Continue routine obstetrical care   Reviewed: Preterm labor symptoms and general obstetric precautions including but not limited to vaginal bleeding, contractions, leaking of fluid and fetal movement were reviewed in detail with the patient.  All questions were answered. Has home bp cuff. Check bp weekly, let us know if >140/90.   Follow-up: Return in about 4 weeks (around 10/21/2022) for [place].  No orders of the defined types were placed in this encounter.  Myrtis Ser CNM 09/23/2022 4:00 PM

## 2022-09-23 NOTE — Patient Instructions (Signed)
Desiree Harper, thank you for choosing our office today! We appreciate the opportunity to meet your healthcare needs. You may receive a short survey by mail, e-mail, or through MyChart. If you are happy with your care we would appreciate if you could take just a few minutes to complete the survey questions. We read all of your comments and take your feedback very seriously. Thank you again for choosing our office.  [facility] Team at [facility] [place] ([address] [place], [place] [address]) Entrance C, located off of [address] Free 24/7 valet parking  Go to [email or web address] to register for FREE online childbirth classes  Call the office ([phone]) or go to [facility] if: You begin to severe cramping Your water breaks.  Sometimes it is a big gush of fluid, sometimes it is just a trickle that keeps getting your panties wet or running down your legs You have vaginal bleeding.  It is normal to have a small amount of spotting if your cervix was checked.   [place] [facility]/Family Doctors [facility] ([facility]): [address], [phone]           [facility]: [address], [phone]                 [facility] ([facility]): [address], [phone] (call to ask if accepting patients) [facility]: [address], [place], [phone]    [place] Pediatric[facility]/Family Doctors [facility] ([facility]): [address], [phone] [facility]: [address], [phone] [facility]: [address], [phone]  [place] [facility]  [facility] ([facility]): [phone] [facility]: [address], [phone]   [place] [facility]: [address], [phone]  [place] [facility]  [facility]: [phone], [phone]  Home Blood Pressure Monitoring for Patients   Your provider has recommended that you check your blood pressure (BP) at least once a week at home. If you do not have a blood pressure cuff at home, one will be provided for you. Contact your provider if you have not received your monitor within 1 week.   Helpful Tips for Accurate Home Blood Pressure Checks  Don't smoke, exercise, or drink caffeine 30 minutes before checking your BP Use the restroom before checking your BP (a full bladder can raise your pressure) Relax in a comfortable upright chair Feet on the ground Left arm resting comfortably on a flat surface at the level of your heart Legs uncrossed Back supported Sit quietly and don't talk Place the cuff on your bare arm Adjust snuggly, so that only two fingertips can fit between your skin and the top of the cuff Check 2 readings separated by at least one minute Keep a log of your BP readings For a visual, please reference this diagram: http://[facility].care/bpdiagram  Provider Name: [facility]     Phone: [phone]  Zone 1: ALL CLEAR  Continue to monitor your symptoms:  BP reading is less than 140 (top number) or less than 90 (bottom number)  No right upper stomach pain No headaches or seeing spots No feeling nauseated or throwing up No swelling in face and hands  Zone 2: CAUTION Call your doctor's office for any of the following:  BP reading is greater than 140 (top number) or greater than   90 (bottom number)  Stomach pain under your ribs in the middle or right side Headaches or seeing spots Feeling nauseated or throwing up Swelling in face and hands  Zone 3: EMERGENCY  Seek immediate medical care if you have any of the following:  BP reading is greater than160 (top number) or greater than 110 (bottom number) Severe headaches not improving with Tylenol Serious difficulty catching your breath Any worsening symptoms from  Zone 2     Second Trimester of Pregnancy The second trimester is from week 14 through week 27 (months 4 through 6). The second trimester is often a time when you feel your best. Your body has adjusted to being pregnant, and you begin to feel better physically. Usually, morning sickness has lessened or quit completely, you may have more energy, and you may have an increase in appetite. The second trimester is also a time when the fetus is growing rapidly. At the end of the sixth month, the fetus is about 9 inches long and weighs about 1 pounds. You will likely begin to feel the baby move (quickening) between 16 and 20 weeks of pregnancy. Body changes during your second trimester Your body continues to go through many changes during your second trimester. The changes vary from woman to woman. Your weight will continue to increase. You will notice your lower abdomen bulging out. You may begin to get stretch marks on your hips, abdomen, and breasts. You may develop headaches that can be relieved by medicines. The medicines should be approved by your health care provider. You may urinate more often because the fetus is pressing on your bladder. You may develop or continue to have heartburn as a result of your pregnancy. You may develop constipation because certain hormones are causing the muscles that push waste through your intestines to slow down. You may develop hemorrhoids or swollen, bulging veins (varicose veins). You may have back pain. This is caused by: Weight gain. Pregnancy hormones that are relaxing the joints in your pelvis. A shift in weight and the muscles that support your balance. Your breasts will continue to grow and they will continue to become tender. Your gums may bleed and may be sensitive to brushing and flossing. Dark spots or blotches (chloasma, mask of pregnancy) may develop on your face. This will likely fade after the baby is born. A dark line from your belly button to  the pubic area (linea nigra) may appear. This will likely fade after the baby is born. You may have changes in your hair. These can include thickening of your hair, rapid growth, and changes in texture. Some women also have hair loss during or after pregnancy, or hair that feels dry or thin. Your hair will most likely return to normal after your baby is born.  What to expect at prenatal visits During a routine prenatal visit: You will be weighed to make sure you and the fetus are growing normally. Your blood pressure will be taken. Your abdomen will be measured to track your baby's growth. The fetal heartbeat will be listened to. Any test results from the previous visit will be discussed.  Your health care provider may ask you: How you are feeling. If you are feeling the baby move. If you have had any abnormal symptoms, such as leaking fluid, bleeding, severe headaches, or abdominal cramping. If you are using any tobacco products, including cigarettes, chewing tobacco, and electronic cigarettes. If you have any questions.  Other tests that may be performed during   your second trimester include: Blood tests that check for: Low iron levels (anemia). High blood sugar that affects pregnant women (gestational diabetes) between 24 and 28 weeks. Rh antibodies. This is to check for a protein on red blood cells (Rh factor). Urine tests to check for infections, diabetes, or protein in the urine. An ultrasound to confirm the proper growth and development of the baby. An amniocentesis to check for possible genetic problems. Fetal screens for spina bifida and Down syndrome. HIV (human immunodeficiency virus) testing. Routine prenatal testing includes screening for HIV, unless you choose not to have this test.  Follow these instructions at home: Medicines Follow your health care provider's instructions regarding medicine use. Specific medicines may be either safe or unsafe to take during  pregnancy. Take a prenatal vitamin that contains at least 600 micrograms (mcg) of folic acid. If you develop constipation, try taking a stool softener if your health care provider approves. Eating and drinking Eat a balanced diet that includes fresh fruits and vegetables, whole grains, good sources of protein such as meat, eggs, or tofu, and low-fat dairy. Your health care provider will help you determine the amount of weight gain that is right for you. Avoid raw meat and uncooked cheese. These carry germs that can cause birth defects in the baby. If you have low calcium intake from food, talk to your health care provider about whether you should take a daily calcium supplement. Limit foods that are high in fat and processed sugars, such as fried and sweet foods. To prevent constipation: Drink enough fluid to keep your urine clear or pale yellow. Eat foods that are high in fiber, such as fresh fruits and vegetables, whole grains, and beans. Activity Exercise only as directed by your health care provider. Most women can continue their usual exercise routine during pregnancy. Try to exercise for 30 minutes at least 5 days a week. Stop exercising if you experience uterine contractions. Avoid heavy lifting, wear low heel shoes, and practice good posture. A sexual relationship may be continued unless your health care provider directs you otherwise. Relieving pain and discomfort Wear a good support bra to prevent discomfort from breast tenderness. Take warm sitz baths to soothe any pain or discomfort caused by hemorrhoids. Use hemorrhoid cream if your health care provider approves. Rest with your legs elevated if you have leg cramps or low back pain. If you develop varicose veins, wear support hose. Elevate your feet for 15 minutes, 3-4 times a day. Limit salt in your diet. Prenatal Care Write down your questions. Take them to your prenatal visits. Keep all your prenatal visits as told by your health  care provider. This is important. Safety Wear your seat belt at all times when driving. Make a list of emergency phone numbers, including numbers for family, friends, the hospital, and police and fire departments. General instructions Ask your health care provider for a referral to a local prenatal education class. Begin classes no later than the beginning of month 6 of your pregnancy. Ask for help if you have counseling or nutritional needs during pregnancy. Your health care provider can offer advice or refer you to specialists for help with various needs. Do not use hot tubs, steam rooms, or saunas. Do not douche or use tampons or scented sanitary pads. Do not cross your legs for long periods of time. Avoid cat litter boxes and soil used by cats. These carry germs that can cause birth defects in the baby and possibly loss of the   fetus by miscarriage or stillbirth. Avoid all smoking, herbs, alcohol, and unprescribed drugs. Chemicals in these products can affect the formation and growth of the baby. Do not use any products that contain nicotine or tobacco, such as cigarettes and e-cigarettes. If you need help quitting, ask your health care provider. Visit your dentist if you have not gone yet during your pregnancy. Use a soft toothbrush to brush your teeth and be gentle when you floss. Contact a health care provider if: You have dizziness. You have mild pelvic cramps, pelvic pressure, or nagging pain in the abdominal area. You have persistent nausea, vomiting, or diarrhea. You have a bad smelling vaginal discharge. You have pain when you urinate. Get help right away if: You have a fever. You are leaking fluid from your vagina. You have spotting or bleeding from your vagina. You have severe abdominal cramping or pain. You have rapid weight gain or weight loss. You have shortness of breath with chest pain. You notice sudden or extreme swelling of your face, hands, ankles, feet, or legs. You  have not felt your baby move in over an hour. You have severe headaches that do not go away when you take medicine. You have vision changes. Summary The second trimester is from week 14 through week 27 (months 4 through 6). It is also a time when the fetus is growing rapidly. Your body goes through many changes during pregnancy. The changes vary from woman to woman. Avoid all smoking, herbs, alcohol, and unprescribed drugs. These chemicals affect the formation and growth your baby. Do not use any tobacco products, such as cigarettes, chewing tobacco, and e-cigarettes. If you need help quitting, ask your health care provider. Contact your health care provider if you have any questions. Keep all prenatal visits as told by your health care provider. This is important. This information is not intended to replace advice given to you by your health care provider. Make sure you discuss any questions you have with your health care provider. Document Released: 06/02/2001 Document Revised: 11/14/2015 Document Reviewed: 08/09/2012 Elsevier Interactive Patient Education  2017 [employer]  

## 2022-09-23 NOTE — Progress Notes (Signed)
[place] Q000111Q wks,cephalic,cx 3.6 cm,anterior placenta gr 0,SVP of fluid 4.5 cm,FHR 140 bpm,EFW 307 g 76%,anatomy complete,no obvious abnormalities

## 2022-09-24 ENCOUNTER — Encounter: Payer: Self-pay | Admitting: Obstetrics & Gynecology

## 2022-09-29 NOTE — Progress Notes (Signed)
Order(s) created erroneously. Erroneous order ID: [id number]  Order canceled by: CHART CORRECTION ANALYST TWELVE, IDENTITY  Order cancel date/time: 09/29/2022 7:39 AM

## 2022-09-30 ENCOUNTER — Encounter: Payer: Medicare HMO | Admitting: Advanced Practice Midwife

## 2022-09-30 ENCOUNTER — Other Ambulatory Visit: Payer: Medicare HMO

## 2022-10-05 ENCOUNTER — Encounter: Payer: Self-pay | Admitting: Obstetrics & Gynecology

## 2022-10-21 ENCOUNTER — Ambulatory Visit (INDEPENDENT_AMBULATORY_CARE_PROVIDER_SITE_OTHER): Payer: Medicare HMO | Admitting: Advanced Practice Midwife

## 2022-10-21 ENCOUNTER — Encounter: Payer: Self-pay | Admitting: Advanced Practice Midwife

## 2022-10-21 VITALS — BP 117/70 | HR 91 | Wt 163.0 lb

## 2022-10-21 DIAGNOSIS — Z3402 Encounter for supervision of normal first pregnancy, second trimester: Secondary | ICD-10-CM

## 2022-10-21 DIAGNOSIS — Z3A23 23 weeks gestation of pregnancy: Secondary | ICD-10-CM

## 2022-10-21 NOTE — Progress Notes (Signed)
   LOW-RISK PREGNANCY VISIT Patient name: Desiree Harper MRN [id number]  Date of birth: 02/10/1996 Chief Complaint:   Routine Prenatal Visit  History of Present Illness:   Desiree Harper is a 27 y.o. G6P0000 female at [redacted]w[redacted]d with an Estimated Date of Delivery: 02/16/23 being seen today for ongoing management of a low-risk pregnancy.  Today she reports  being worried that the baby isn't moving enough; stressed bc grandma now has dementia and is in assisted living . Contractions: Not present.  .  Movement: Present. denies leaking of fluid. Review of Systems:   Pertinent items are noted in HPI Denies abnormal vaginal discharge w/ itching/odor/irritation, headaches, visual changes, shortness of breath, chest pain, abdominal pain, severe nausea/vomiting, or problems with urination or bowel movements unless otherwise stated above. Pertinent History Reviewed:  Reviewed past medical,surgical, social, obstetrical and family history.  Reviewed problem list, medications and allergies. Physical Assessment:   Vitals:   10/21/22 1032  BP: 117/70  Pulse: 91  Weight: 163 lb (73.9 kg)  Body mass index is 27.12 kg/m.        Physical Examination:   General appearance: Well appearing, and in no distress  Mental status: Alert, oriented to person, place, and time  Skin: Warm & dry  Cardiovascular: Normal heart rate noted  Respiratory: Normal respiratory effort, no distress  Abdomen: Soft, gravid, nontender  Pelvic: Cervical exam deferred         Extremities: Edema: None  Fetal Status: Fetal Heart Rate (bpm): 149   Movement: Present    No results found for this or any previous visit (from the past 24 hour(s)).  Assessment & Plan:  1) Low-risk pregnancy G1P0000 at [redacted]w[redacted]d with an Estimated Date of Delivery: 02/16/23   2) Fetal movement, discussed what is normal and when to expect more regular movement (28wks)   Meds: No orders of the defined types were placed in this encounter.  Labs/procedures  today: none  Plan:  Continue routine obstetrical care   Reviewed: Preterm labor symptoms and general obstetric precautions including but not limited to vaginal bleeding, contractions, leaking of fluid and fetal movement were reviewed in detail with the patient.  All questions were answered. Has home bp cuff. Check bp weekly, let us know if >140/90.   Follow-up: Return in about 4 weeks (around 11/18/2022) for LROB, PN2, in person.  No orders of the defined types were placed in this encounter.  [name] CNM 10/21/2022 10:55 AM

## 2022-10-21 NOTE — Patient Instructions (Signed)
**Note Desiree-Identified via Obfuscation** Desiree Harper, I greatly value your feedback.  If you receive a survey following your visit with [place] today, we appreciate you taking the time to fill it out.  Thanks, Desiree Harper, CNM   You will have your sugar test next visit.  Please do not eat or drink anything after midnight the night before you come, not even water.  You will be here for at least two hours.  Please make an appointment online for the bloodwork at [email or web address] for 8:30am (or as close to this as possible). Make sure you select the [facility] service center. The day of the appointment, check in with our office first, then you will go to [facility] to start the sugar test.    [facility] HAS MOVED!!! It is now [facility] ([address] [place], [place] [address]) Entrance C, located off of [address] valet parking  Go to [email or web address] to register for FREE online childbirth classes   Call the office [phone]) or go to [facility] if: You begin to have strong, frequent contractions Your water breaks.  Sometimes it is a big gush of fluid, sometimes it is just a trickle that keeps getting your panties wet or running down your legs You have vaginal bleeding.  It is normal to have a small amount of spotting if your cervix was checked.  You don't feel your baby moving like normal.  If you don't, get you something to eat and drink and lay down and focus on feeling your baby move.   If your baby is still not moving like normal, you should call the office or go to [facility].  [place] Pediatricians/Family Doctors: [facility] [phone]           [facility] [phone]                [facility] [phone] (usually not accepting new patients unless you have family there already, you are always welcome to call and ask)      [facility] [phone]       [facility] Pediatricians/Family Doctors:  [facility]: [phone] [facility]: [phone] [facility]: [phone]  [facility] Doctors:  [facility]: [phone]  [facility]: [phone]  [facility] Doctors: [facility]: [phone]   Home Blood Pressure Monitoring for Patients   Your provider has recommended that you check your blood pressure (BP) at least once a week at home. If you do not have a blood pressure cuff at home, one will be provided for you. Contact your provider if you have not received your monitor within 1 week.   Helpful Tips for Accurate Home Blood Pressure Checks  Don't smoke, exercise, or drink caffeine 30 minutes before checking your BP Use the restroom before checking your BP (a full bladder can raise your pressure) Relax in a comfortable upright chair Feet on the ground Left arm resting comfortably on a flat surface at the level of your heart Legs uncrossed Back supported Sit quietly and don't talk Place the cuff on your bare arm Adjust snuggly, so that only two fingertips can fit between your skin and the top of the cuff Check 2 readings separated by at least one minute Keep a log of your BP readings For a visual, please reference this diagram: http://ccnc.care/bpdiagram  Provider Name: [facility]     Phone: [phone]  Zone 1: ALL CLEAR  Continue to monitor your symptoms:  BP reading is less than 140 (top number) or less than 90 (bottom number)  No right upper stomach pain No headaches or seeing spots No feeling nauseated or throwing up No swelling in face and hands  Zone 2: CAUTION Call your doctor's office for any of the following:  BP reading is greater than 140 (top number) or greater than 90 (bottom number)  Stomach pain under your ribs in the middle or right side Headaches or seeing spots Feeling nauseated or throwing up Swelling in face and hands  Zone 3: EMERGENCY  Seek  immediate medical care if you have any of the following:  BP reading is greater than160 (top number) or greater than 110 (bottom number) Severe headaches not improving with Tylenol Serious difficulty catching your breath Any worsening symptoms from Zone 2   Second Trimester of Pregnancy The second trimester is from week 13 through week 28, months 4 through 6. The second trimester is often a time when you feel your best. Your body has also adjusted to being pregnant, and you begin to feel better physically. Usually, morning sickness has lessened or quit completely, you may have more energy, and you may have an increase in appetite. The second trimester is also a time when the fetus is growing rapidly. At the end of the sixth month, the fetus is about 9 inches long and weighs about 1 pounds. You will likely begin to feel the baby move (quickening) between 18 and 20 weeks of the pregnancy. BODY CHANGES Your body goes through many changes during pregnancy. The changes vary from woman to woman.  Your weight will continue to increase. You will notice your lower abdomen bulging out. You may begin to get stretch marks on your hips, abdomen, and breasts. You may develop headaches that can be relieved by medicines approved by your health care provider. You may urinate more often because the fetus is pressing on your bladder. You may develop or continue to have heartburn as a result of your pregnancy. You may develop constipation because certain hormones are causing the muscles that push waste through your intestines to slow down. You may develop hemorrhoids or swollen, bulging veins (varicose veins). You may have back pain because of the weight gain and pregnancy hormones relaxing your joints between the bones in your pelvis and as a result of a shift in weight and the muscles that support your balance. Your breasts will continue to grow and be tender. Your gums may bleed and may be sensitive to brushing  and flossing. Dark spots or blotches (chloasma, mask of pregnancy) may develop on your face. This will likely fade after the baby is born. A dark line from your belly button to the pubic area (linea nigra) may appear. This will likely fade after the baby is born. You may have changes in your hair. These can include thickening of your hair, rapid growth, and changes in texture. Some women also have hair loss during or after pregnancy, or hair that feels dry or thin. Your hair will most likely return to normal after your baby is born. WHAT TO EXPECT AT YOUR PRENATAL VISITS During a routine prenatal visit: You will be weighed to make sure you and the fetus are growing normally. Your blood pressure will be taken. Your abdomen will be measured to track your baby's growth. The fetal heartbeat will be listened to. Any test results from the previous visit will be discussed. Your health care provider  may ask you: How you are feeling. If you are feeling the baby move. If you have had any abnormal symptoms, such as leaking fluid, bleeding, severe headaches, or abdominal cramping. If you have any questions. Other tests that may be performed during your second trimester include: Blood tests that check for: Low iron levels (anemia). Gestational diabetes (between 24 and 28 weeks). Rh antibodies. Urine tests to check for infections, diabetes, or protein in the urine. An ultrasound to confirm the proper growth and development of the baby. An amniocentesis to check for possible genetic problems. Fetal screens for spina bifida and Down syndrome. HOME CARE INSTRUCTIONS  Avoid all smoking, herbs, alcohol, and unprescribed drugs. These chemicals affect the formation and growth of the baby. Follow your health care provider's instructions regarding medicine use. There are medicines that are either safe or unsafe to take during pregnancy. Exercise only as directed by your health care provider. Experiencing  uterine cramps is a good sign to stop exercising. Continue to eat regular, healthy meals. Wear a good support bra for breast tenderness. Do not use hot tubs, steam rooms, or saunas. Wear your seat belt at all times when driving. Avoid raw meat, uncooked cheese, cat litter boxes, and soil used by cats. These carry germs that can cause birth defects in the baby. Take your prenatal vitamins. Try taking a stool softener (if your health care provider approves) if you develop constipation. Eat more high-fiber foods, such as fresh vegetables or fruit and whole grains. Drink plenty of fluids to keep your urine clear or pale yellow. Take warm sitz baths to soothe any pain or discomfort caused by hemorrhoids. Use hemorrhoid cream if your health care provider approves. If you develop varicose veins, wear support hose. Elevate your feet for 15 minutes, 3-4 times a day. Limit salt in your diet. Avoid heavy lifting, wear low heel shoes, and practice good posture. Rest with your legs elevated if you have leg cramps or low back pain. Visit your dentist if you have not gone yet during your pregnancy. Use a soft toothbrush to brush your teeth and be gentle when you floss. A sexual relationship may be continued unless your health care provider directs you otherwise. Continue to go to all your prenatal visits as directed by your health care provider. SEEK MEDICAL CARE IF:  You have dizziness. You have mild pelvic cramps, pelvic pressure, or nagging pain in the abdominal area. You have persistent nausea, vomiting, or diarrhea. You have a bad smelling vaginal discharge. You have pain with urination. SEEK IMMEDIATE MEDICAL CARE IF:  You have a fever. You are leaking fluid from your vagina. You have spotting or bleeding from your vagina. You have severe abdominal cramping or pain. You have rapid weight gain or loss. You have shortness of breath with chest pain. You notice sudden or extreme swelling of your face,  hands, ankles, feet, or legs. You have not felt your baby move in over an hour. You have severe headaches that do not go away with medicine. You have vision changes. Document Released: 06/02/2001 Document Revised: 06/13/2013 Document Reviewed: 08/09/2012 [facility] Patient Information 2015 [place], [place]. This information is not intended to replace advice given to you by your health care provider. Make sure you discuss any questions you have with your health care provider.

## 2022-10-27 ENCOUNTER — Ambulatory Visit (INDEPENDENT_AMBULATORY_CARE_PROVIDER_SITE_OTHER): Payer: Medicare HMO | Admitting: *Deleted

## 2022-10-27 VITALS — BP 118/69 | HR 89

## 2022-10-27 DIAGNOSIS — O98212 Gonorrhea complicating pregnancy, second trimester: Secondary | ICD-10-CM

## 2022-10-27 DIAGNOSIS — Z3402 Encounter for supervision of normal first pregnancy, second trimester: Secondary | ICD-10-CM

## 2022-10-27 NOTE — Progress Notes (Signed)
   NURSE VISIT- Fetal Heart Rate Check  SUBJECTIVE:  Desiree Harper is a 27 y.o. G1P0000 female at [redacted]w[redacted]d, here for a fetal heart rate check as she has not felt fetal movement in 24 hours despite laying on her side, eating/drinking, etc.   OBJECTIVE:  LMP 04/14/2022 (Exact Date)   Appears anxious, concerned  FHR 146 via doppler   ASSESSMENT: G1P0000 at [redacted]w[redacted]d with present fetal heart rate Pt reassured that this is normal for 24 weeks.  Discussed to start doing kick counts at 28 weeks  PLAN: Follow-up as scheduled  Jobe Marker  10/27/2022 3:54 PM

## 2022-10-29 ENCOUNTER — Encounter: Payer: Self-pay | Admitting: Obstetrics & Gynecology

## 2022-11-19 ENCOUNTER — Other Ambulatory Visit: Payer: Medicare HMO

## 2022-11-19 ENCOUNTER — Encounter: Payer: Self-pay | Admitting: Advanced Practice Midwife

## 2022-11-19 ENCOUNTER — Ambulatory Visit (INDEPENDENT_AMBULATORY_CARE_PROVIDER_SITE_OTHER): Payer: Medicare HMO | Admitting: Advanced Practice Midwife

## 2022-11-19 VITALS — BP 105/74 | HR 86 | Wt 171.0 lb

## 2022-11-19 DIAGNOSIS — Z3402 Encounter for supervision of normal first pregnancy, second trimester: Secondary | ICD-10-CM

## 2022-11-19 DIAGNOSIS — Z3A27 27 weeks gestation of pregnancy: Secondary | ICD-10-CM

## 2022-11-19 DIAGNOSIS — Z131 Encounter for screening for diabetes mellitus: Secondary | ICD-10-CM

## 2022-11-19 DIAGNOSIS — Z23 Encounter for immunization: Secondary | ICD-10-CM | POA: Diagnosis not present

## 2022-11-19 DIAGNOSIS — Z3482 Encounter for supervision of other normal pregnancy, second trimester: Secondary | ICD-10-CM

## 2022-11-19 NOTE — Addendum Note (Signed)
Addended by: Federico Flake A on: 11/19/2022 10:25 AM   Modules accepted: Orders

## 2022-11-19 NOTE — Patient Instructions (Signed)
**Note Desiree-Identified via Obfuscation** Desiree Harper, I greatly value your feedback.  If you receive a survey following your visit with [place] today, we appreciate you taking the time to fill it out.  Thanks, Cathie Beams, CNM   [facility] HAS MOVED!!! It is now [facility] ([address] [place], [place] [address]) Entrance located off of [address] Free 24/7 valet parking   Go to [email or web address] to register for FREE online childbirth classes    Call the office [phone]) or go to [facility] if: You begin to have strong, frequent contractions Your water breaks.  Sometimes it is a big gush of fluid, sometimes it is just a trickle that keeps getting your panties wet or running down your legs You have vaginal bleeding.  It is normal to have a small amount of spotting if your cervix was checked.  You don't feel your baby moving like normal.  If you don't, get you something to eat and drink and lay down and focus on feeling your baby move.  You should feel at least 10 movements in 2 hours.  If you don't, you should call the office or go to [facility]    Tdap Vaccine It is recommended that you get the Tdap vaccine during the third trimester of EACH pregnancy to help protect your baby from getting pertussis (whooping cough) 27-36 weeks is the BEST time to do this so that you can pass the protection on to your baby. During pregnancy is better than after pregnancy, but if you are unable to get it during pregnancy it will be offered at the hospital.  You will be offered this vaccine in the office after 27 weeks. If you do not have health insurance, you can get this vaccine at the health department or your family doctor Everyone who will be around your baby should also be up-to-date on their vaccines. Adults (who are not pregnant) only need 1 dose of Tdap during adulthood.   Third Trimester of Pregnancy The third trimester is from week 29 through week 42, months 7 through 9. The third  trimester is a time when the fetus is growing rapidly. At the end of the ninth month, the fetus is about 20 inches in length and weighs 6-10 pounds.  BODY CHANGES Your body goes through many changes during pregnancy. The changes vary from woman to woman.  Your weight will continue to increase. You can expect to gain 25-35 pounds (11-16 kg) by the end of the pregnancy. You may begin to get stretch marks on your hips, abdomen, and breasts. You may urinate more often because the fetus is moving lower into your pelvis and pressing on your bladder. You may develop or continue to have heartburn as a result of your pregnancy. You may develop constipation because certain hormones are causing the muscles that push waste through your intestines to slow down. You may develop hemorrhoids or swollen, bulging veins (varicose veins). You may have pelvic pain because of the weight gain and pregnancy hormones relaxing your joints between the bones in your pelvis. Backaches may result from overexertion of the muscles supporting your posture. You may have changes in your hair. These can include thickening of your hair, rapid growth, and changes in texture. Some women also have hair loss during or after pregnancy, or hair that feels dry or thin. Your hair will most likely return to normal after your baby is born. Your breasts will continue to grow and be tender. A  yellow discharge may leak from your breasts called colostrum. Your belly button may stick out. You may feel short of breath because of your expanding uterus. You may notice the fetus "dropping," or moving lower in your abdomen. You may have a bloody mucus discharge. This usually occurs a few days to a week before labor begins. Your cervix becomes thin and soft (effaced) near your due date. WHAT TO EXPECT AT YOUR PRENATAL EXAMS  You will have prenatal exams every 2 weeks until week 36. Then, you will have weekly prenatal exams. During a routine prenatal  visit: You will be weighed to make sure you and the fetus are growing normally. Your blood pressure is taken. Your abdomen will be measured to track your baby's growth. The fetal heartbeat will be listened to. Any test results from the previous visit will be discussed. You may have a cervical check near your due date to see if you have effaced. At around 36 weeks, your caregiver will check your cervix. At the same time, your caregiver will also perform a test on the secretions of the vaginal tissue. This test is to determine if a type of bacteria, Group B streptococcus, is present. Your caregiver will explain this further. Your caregiver may ask you: What your birth plan is. How you are feeling. If you are feeling the baby move. If you have had any abnormal symptoms, such as leaking fluid, bleeding, severe headaches, or abdominal cramping. If you have any questions. Other tests or screenings that may be performed during your third trimester include: Blood tests that check for low iron levels (anemia). Fetal testing to check the health, activity level, and growth of the fetus. Testing is done if you have certain medical conditions or if there are problems during the pregnancy. FALSE LABOR You may feel small, irregular contractions that eventually go away. These are called Braxton Hicks contractions, or false labor. Contractions may last for hours, days, or even weeks before true labor sets in. If contractions come at regular intervals, intensify, or become painful, it is best to be seen by your caregiver.  SIGNS OF LABOR  Menstrual-like cramps. Contractions that are 5 minutes apart or less. Contractions that start on the top of the uterus and spread down to the lower abdomen and back. A sense of increased pelvic pressure or back pain. A watery or bloody mucus discharge that comes from the vagina. If you have any of these signs before the 37th week of pregnancy, call your caregiver right away.  You need to go to the hospital to get checked immediately. HOME CARE INSTRUCTIONS  Avoid all smoking, herbs, alcohol, and unprescribed drugs. These chemicals affect the formation and growth of the baby. Follow your caregiver's instructions regarding medicine use. There are medicines that are either safe or unsafe to take during pregnancy. Exercise only as directed by your caregiver. Experiencing uterine cramps is a good sign to stop exercising. Continue to eat regular, healthy meals. Wear a good support bra for breast tenderness. Do not use hot tubs, steam rooms, or saunas. Wear your seat belt at all times when driving. Avoid raw meat, uncooked cheese, cat litter boxes, and soil used by cats. These carry germs that can cause birth defects in the baby. Take your prenatal vitamins. Try taking a stool softener (if your caregiver approves) if you develop constipation. Eat more high-fiber foods, such as fresh vegetables or fruit and whole grains. Drink plenty of fluids to keep your urine clear or pale yellow.  Take warm sitz baths to soothe any pain or discomfort caused by hemorrhoids. Use hemorrhoid cream if your caregiver approves. If you develop varicose veins, wear support hose. Elevate your feet for 15 minutes, 3-4 times a day. Limit salt in your diet. Avoid heavy lifting, wear low heal shoes, and practice good posture. Rest a lot with your legs elevated if you have leg cramps or low back pain. Visit your dentist if you have not gone during your pregnancy. Use a soft toothbrush to brush your teeth and be gentle when you floss. A sexual relationship may be continued unless your caregiver directs you otherwise. Do not travel far distances unless it is absolutely necessary and only with the approval of your caregiver. Take prenatal classes to understand, practice, and ask questions about the labor and delivery. Make a trial run to the hospital. Pack your hospital bag. Prepare the baby's  nursery. Continue to go to all your prenatal visits as directed by your caregiver. SEEK MEDICAL CARE IF: You are unsure if you are in labor or if your water has broken. You have dizziness. You have mild pelvic cramps, pelvic pressure, or nagging pain in your abdominal area. You have persistent nausea, vomiting, or diarrhea. You have a bad smelling vaginal discharge. You have pain with urination. SEEK IMMEDIATE MEDICAL CARE IF:  You have a fever. You are leaking fluid from your vagina. You have spotting or bleeding from your vagina. You have severe abdominal cramping or pain. You have rapid weight loss or gain. You have shortness of breath with chest pain. You notice sudden or extreme swelling of your face, hands, ankles, feet, or legs. You have not felt your baby move in over an hour. You have severe headaches that do not go away with medicine. You have vision changes. Document Released: 06/02/2001 Document Revised: 06/13/2013 Document Reviewed: 08/09/2012 [facility] Patient Information 2015 [place], [place]. This information is not intended to replace advice given to you by your health care provider. Make sure you discuss any questions you have with your health care provider.

## 2022-11-19 NOTE — Progress Notes (Signed)
   LOW-RISK PREGNANCY VISIT Patient name: Desiree Harper MRN [id number]  Date of birth: Sep 30, 1995 Chief Complaint:   Routine Prenatal Visit (PN2/ information on tdap given)  History of Present Illness:   Desiree Harper is a 27 y.o. G1P0000 female at [redacted]w[redacted]d with an Estimated Date of Delivery: 02/16/23 being seen today for ongoing management of a low-risk pregnancy.  Today she reports no complaints. Contractions: Not present.  .  Movement: Present. denies leaking of fluid. Review of Systems:   Pertinent items are noted in HPI Denies abnormal vaginal discharge w/ itching/odor/irritation, headaches, visual changes, shortness of breath, chest pain, abdominal pain, severe nausea/vomiting, or problems with urination or bowel movements unless otherwise stated above. Pertinent History Reviewed:  Reviewed past medical,surgical, social, obstetrical and family history.  Reviewed problem list, medications and allergies. Physical Assessment:   Vitals:   11/19/22 0935  BP: 105/74  Pulse: 86  Weight: 171 lb (77.6 kg)  Body mass index is 28.46 kg/m.        Physical Examination:   General appearance: Well appearing, and in no distress  Mental status: Alert, oriented to person, place, and time  Skin: Warm & dry  Cardiovascular: Normal heart rate noted  Respiratory: Normal respiratory effort, no distress  Abdomen: Soft, gravid, nontender  Pelvic: Cervical exam deferred         Extremities: Edema: Trace  Fetal Status: Fetal Heart Rate (bpm): 142 Fundal Height: 28 cm Movement: Present    Chaperone:  N/A    No results found for this or any previous visit (from the past 24 hour(s)).  Assessment & Plan:    Pregnancy: G1P0000 at [redacted]w[redacted]d 1. [redacted] weeks gestation of pregnancy   2. Supervision of normal intrauterine pregnancy in multigravida, second trimester      Meds: No orders of the defined types were placed in this encounter.  Labs/procedures today: PN2, Tdap  Plan:  Continue routine  obstetrical care  Next visit: prefers in person    Reviewed: Preterm labor symptoms and general obstetric precautions including but not limited to vaginal bleeding, contractions, leaking of fluid and fetal movement were reviewed in detail with the patient.  All questions were answered. Has home bp cuff. Check bp weekly, let us know if >140/90.   Follow-up: Return in about 4 weeks (around 12/17/2022) for LROB.  No future appointments.   No orders of the defined types were placed in this encounter.  Jacklyn Shell DNP, CNM 11/19/2022 10:00 AM

## 2022-11-20 LAB — GLUCOSE TOLERANCE, 2 HOURS W/ 1HR
Glucose, 1 hour: 81 mg/dL (ref 70–179)
Glucose, 2 hour: 72 mg/dL (ref 70–152)
Glucose, Fasting: 75 mg/dL (ref 70–91)

## 2022-11-20 LAB — CBC
Hematocrit: 39.3 % (ref 34.0–46.6)
Hemoglobin: 13 g/dL (ref 11.1–15.9)
MCH: 30.7 pg (ref 26.6–33.0)
MCHC: 33.1 g/dL (ref 31.5–35.7)
MCV: 93 fL (ref 79–97)
Platelets: 255 10*3/uL (ref 150–450)
RBC: 4.23 x10E6/uL (ref 3.77–5.28)
RDW: 12 % (ref 11.7–15.4)
WBC: 12.4 10*3/uL — ABNORMAL HIGH (ref 3.4–10.8)

## 2022-11-20 LAB — RPR: RPR Ser Ql: NONREACTIVE

## 2022-11-20 LAB — HIV ANTIBODY (ROUTINE TESTING W REFLEX): HIV Screen 4th Generation wRfx: NONREACTIVE

## 2022-11-20 LAB — ANTIBODY SCREEN: Antibody Screen: NEGATIVE

## 2022-12-17 ENCOUNTER — Ambulatory Visit (INDEPENDENT_AMBULATORY_CARE_PROVIDER_SITE_OTHER): Payer: Medicare HMO | Admitting: Advanced Practice Midwife

## 2022-12-17 VITALS — BP 124/77 | HR 88 | Wt 177.0 lb

## 2022-12-17 DIAGNOSIS — Z3A31 31 weeks gestation of pregnancy: Secondary | ICD-10-CM | POA: Diagnosis not present

## 2022-12-17 DIAGNOSIS — Z3403 Encounter for supervision of normal first pregnancy, third trimester: Secondary | ICD-10-CM

## 2022-12-17 DIAGNOSIS — O368130 Decreased fetal movements, third trimester, not applicable or unspecified: Secondary | ICD-10-CM

## 2022-12-17 NOTE — Progress Notes (Addendum)
   LOW-RISK PREGNANCY VISIT Patient name: Desiree Harper MRN [id number]  Date of birth: January 15, 1996 Chief Complaint:   Routine Prenatal Visit (Pt stated--bruise on the right upper leg, denied pain--2 days.)  History of Present Illness:   Desiree Harper is a 27 y.o. G30P0000 female at [redacted]w[redacted]d with an Estimated Date of Delivery: 02/16/23 being seen today for ongoing management of a low-risk pregnancy.  Today she reports getting a bruise on thigh. Platelets normal. Contractions: Irregular.  .  Movement: (!) Decreased. Didn't feel baby move yesterday, has felt " a few" movements today   denies leaking of fluid. Review of Systems:   Pertinent items are noted in HPI Denies abnormal vaginal discharge w/ itching/odor/irritation, headaches, visual changes, shortness of breath, chest pain, abdominal pain, severe nausea/vomiting, or problems with urination or bowel movements unless otherwise stated above. Pertinent History Reviewed:  Reviewed past medical,surgical, social, obstetrical and family history.  Reviewed problem list, medications and allergies. Physical Assessment:   Vitals:   12/17/22 0937  BP: 124/77  Pulse: 88  Weight: 177 lb (80.3 kg)  Body mass index is 29.45 kg/m.        Physical Examination:   General appearance: Well appearing, and in no distress  Mental status: Alert, oriented to person, place, and time  Skin: Warm & dry  Cardiovascular: Normal heart rate noted  Respiratory: Normal respiratory effort, no distress  Abdomen: Soft, gravid, nontender  Pelvic: Cervical exam deferred         Extremities:    Fetal Status:   Fundal Height: 32 cm Movement: (!) Decreased    NST: FHR baseline 140 bpm, Variability: moderate, Accelerations:present, Decelerations:  Absent= Cat 1/Reactive   Chaperone:  N/A    No results found for this or any previous visit (from the past 24 hour(s)).  Assessment & Plan:    Pregnancy: G1P0000 at [redacted]w[redacted]d 1. Encounter for supervision of normal first  pregnancy in third trimester      Meds: No orders of the defined types were placed in this encounter.  Labs/procedures today: NST  Plan:  Continue routine obstetrical care  Next visit: prefers in person    Reviewed: Preterm labor symptoms and general obstetric precautions including but not limited to vaginal bleeding, contractions, leaking of fluid and fetal movement were reviewed in detail with the patient.  All questions were answered. Has home bp cuff.. Check bp weekly, let us know if >140/90.   Follow-up: Return in about 2 weeks (around 12/31/2022) for LROB.  Future Appointments  Date Time Provider Department Center  12/31/2022 10:10 AM Lazaro Arms, MD [facility]    No orders of the defined types were placed in this encounter.  Jacklyn Shell DNP, CNM 12/17/2022 11:41 AM

## 2022-12-17 NOTE — Progress Notes (Signed)
error 

## 2022-12-17 NOTE — Patient Instructions (Signed)
**Note Desiree-Identified via Obfuscation** Desiree Harper, I greatly value your feedback.  If you receive a survey following your visit with [place] today, we appreciate you taking the time to fill it out.  Thanks, Desiree Beams, DNP, CNM  [facility] HAS MOVED!!! It is now [facility] ([address] [place], [place] [address]) Entrance located off of [address] Free 24/7 valet parking   Go to [email or web address] to register for FREE online childbirth classes    Call the office [phone]) or go to [facility] if: You begin to have strong, frequent contractions Your water breaks.  Sometimes it is a big gush of fluid, sometimes it is just a trickle that keeps getting your panties wet or running down your legs You have vaginal bleeding.  It is normal to have a small amount of spotting if your cervix was checked.  You don't feel your baby moving like normal.  If you don't, get you something to eat and drink and lay down and focus on feeling your baby move.  You should feel at least 10 movements in 2 hours.  If you don't, you should call the office or go to [facility]   Home Blood Pressure Monitoring for Patients   Your provider has recommended that you check your blood pressure (BP) at least once a week at home. If you do not have a blood pressure cuff at home, one will be provided for you. Contact your provider if you have not received your monitor within 1 week.   Helpful Tips for Accurate Home Blood Pressure Checks  Don't smoke, exercise, or drink caffeine 30 minutes before checking your BP Use the restroom before checking your BP (a full bladder can raise your pressure) Relax in a comfortable upright chair Feet on the ground Left arm resting comfortably on a flat surface at the level of your heart Legs uncrossed Back supported Sit quietly and don't talk Place the cuff on your bare arm Adjust snuggly, so that only two fingertips can fit between your skin and the top  of the cuff Check 2 readings separated by at least one minute Keep a log of your BP readings For a visual, please reference this diagram: http://ccnc.care/bpdiagram  Provider Name: [facility]     Phone: [phone]  Zone 1: ALL CLEAR  Continue to monitor your symptoms:  BP reading is less than 140 (top number) or less than 90 (bottom number)  No right upper stomach pain No headaches or seeing spots No feeling nauseated or throwing up No swelling in face and hands  Zone 2: CAUTION Call your doctor's office for any of the following:  BP reading is greater than 140 (top number) or greater than 90 (bottom number)  Stomach pain under your ribs in the middle or right side Headaches or seeing spots Feeling nauseated or throwing up Swelling in face and hands  Zone 3: EMERGENCY  Seek immediate medical care if you have any of the following:  BP reading is greater than160 (top number) or greater than 110 (bottom number) Severe headaches not improving with Tylenol Serious difficulty catching your breath Any worsening symptoms from Zone 2

## 2022-12-31 ENCOUNTER — Encounter: Payer: Self-pay | Admitting: Obstetrics & Gynecology

## 2022-12-31 ENCOUNTER — Ambulatory Visit (INDEPENDENT_AMBULATORY_CARE_PROVIDER_SITE_OTHER): Payer: Medicare HMO | Admitting: Obstetrics & Gynecology

## 2022-12-31 VITALS — BP 118/77 | HR 92 | Wt 183.0 lb

## 2022-12-31 DIAGNOSIS — Z3402 Encounter for supervision of normal first pregnancy, second trimester: Secondary | ICD-10-CM

## 2022-12-31 NOTE — Progress Notes (Signed)
LOW-RISK PREGNANCY VISIT Patient name: Desiree Harper MRN [id number]  Date of birth: 1995-08-24 Chief Complaint:   Routine Prenatal Visit  History of Present Illness:   Desiree Harper is a 27 y.o. G73P0000 female at [redacted]w[redacted]d with an Estimated Date of Delivery: 02/16/23 being seen today for ongoing management of a low-risk pregnancy.     08/12/2022   10:35 AM  Depression screen PHQ 2/9  Decreased Interest 0  Down, Depressed, Hopeless 0  PHQ - 2 Score 0  Altered sleeping 0  Tired, decreased energy 0  Change in appetite 0  Feeling bad or failure about yourself  0  Trouble concentrating 0  Moving slowly or fidgety/restless 0  Suicidal thoughts 0  PHQ-9 Score 0    Today she reports concerned about movement pattern changes. Contractions: Not present. Vag. Bleeding: None.  Movement: (!) Decreased. denies leaking of fluid. Review of Systems:   Pertinent items are noted in HPI Denies abnormal vaginal discharge w/ itching/odor/irritation, headaches, visual changes, shortness of breath, chest pain, abdominal pain, severe nausea/vomiting, or problems with urination or bowel movements unless otherwise stated above. Pertinent History Reviewed:  Reviewed past medical,surgical, social, obstetrical and family history.  Reviewed problem list, medications and allergies. Physical Assessment:   Vitals:   12/31/22 1004 12/31/22 1012  BP: 137/86 118/77  Pulse: 84 92  Weight: 183 lb (83 kg)   Body mass index is 30.45 kg/m.        Physical Examination:   General appearance: Well appearing, and in no distress  Mental status: Alert, oriented to person, place, and time  Skin: Warm & dry  Cardiovascular: Normal heart rate noted  Respiratory: Normal respiratory effort, no distress  Abdomen: Soft, gravid, nontender  Pelvic: Cervical exam deferred         Extremities: Edema: None  Fetal Status: Fetal Heart Rate (bpm): 140s Fundal Height: 34 cm Movement: (!) Decreased    Desiree Harper is at  [redacted]w[redacted]d Estimated Date of Delivery: 02/16/23  NST being performed due to decreased fetal movement  Today the NST is Reactive  Fetal Monitoring:  Baseline: 140s bpm, Variability: Good {> 6 bpm), Accelerations: Reactive, and Decelerations: Absent   reactive  The accelerations are >15 bpm and more than 2 in 20 minutes  Final diagnosis:  Reactive NST  Lazaro Arms, MD    Chaperone: Jobe Marker    No results found for this or any previous visit (from the past 24 hour(s)).  Assessment & Plan:  1) Low-risk pregnancy G1P0000 at [redacted]w[redacted]d with an Estimated Date of Delivery: 02/16/23   2) Decreased movement, after discussing with patient she is feeling movement but the quality of that movement has changed, we discussed running out of room and how that reflects in the movement pattern quality and intensity, she is reassured and understands  3) Borderline BP, at home it is never close to borderline  Having carpal tunnel symptoms starting   Meds: No orders of the defined types were placed in this encounter.  Labs/procedures today: NST  Plan:  Continue routine obstetrical care  Next visit: prefers in person    Reviewed: Preterm labor symptoms and general obstetric precautions including but not limited to vaginal bleeding, contractions, leaking of fluid and fetal movement were reviewed in detail with the patient.  All questions were answered. Has home bp cuff. Rx faxed to . Check bp weekly, let us know if >140/90.   Follow-up: Return in about 2 weeks (around 01/14/2023) for LROB.  No orders of the defined types were placed in this encounter.   Lazaro Arms, MD 12/31/2022 10:42 AM

## 2023-01-14 ENCOUNTER — Encounter: Payer: Self-pay | Admitting: Obstetrics & Gynecology

## 2023-01-14 ENCOUNTER — Ambulatory Visit (INDEPENDENT_AMBULATORY_CARE_PROVIDER_SITE_OTHER): Payer: Medicare HMO | Admitting: Obstetrics & Gynecology

## 2023-01-14 VITALS — BP 123/75 | HR 87 | Wt 186.4 lb

## 2023-01-14 DIAGNOSIS — Z3403 Encounter for supervision of normal first pregnancy, third trimester: Secondary | ICD-10-CM

## 2023-01-14 NOTE — Progress Notes (Signed)
   LOW-RISK PREGNANCY VISIT Patient name: Desiree Harper MRN [id number]  Date of birth: 08/21/1995 Chief Complaint:   Routine Prenatal Visit  History of Present Illness:   Desiree Harper is a 26 y.o. G32P0000 female at [redacted]w[redacted]d with an Estimated Date of Delivery: 02/16/23 being seen today for ongoing management of a low-risk pregnancy.     08/12/2022   10:35 AM  Depression screen PHQ 2/9  Decreased Interest 0  Down, Depressed, Hopeless 0  PHQ - 2 Score 0  Altered sleeping 0  Tired, decreased energy 0  Change in appetite 0  Feeling bad or failure about yourself  0  Trouble concentrating 0  Moving slowly or fidgety/restless 0  Suicidal thoughts 0  PHQ-9 Score 0    Today she reports no complaints. Contractions: Irritability. Vag. Bleeding: None.  Movement: Present. denies leaking of fluid. Review of Systems:   Pertinent items are noted in HPI Denies abnormal vaginal discharge w/ itching/odor/irritation, headaches, visual changes, shortness of breath, chest pain, abdominal pain, severe nausea/vomiting, or problems with urination or bowel movements unless otherwise stated above. Pertinent History Reviewed:  Reviewed past medical,surgical, social, obstetrical and family history.  Reviewed problem list, medications and allergies.  Physical Assessment:   Vitals:   01/14/23 0959  BP: 123/75  Pulse: 87  Weight: 186 lb 6.4 oz (84.6 kg)  Body mass index is 31.02 kg/m.        Physical Examination:   General appearance: Well appearing, and in no distress  Mental status: Alert, oriented to person, place, and time  Skin: Warm & dry  Respiratory: Normal respiratory effort, no distress  Abdomen: Soft, gravid, nontender  Pelvic: Cervical exam deferred         Extremities:  no edema, no calf tenderness bilaterally  Psych:  mood and affect appropriate  Fetal Status: Fetal Heart Rate (bpm): 140 Fundal Height: 34 cm Movement: Present    Chaperone: n/a    No results found for this or any  previous visit (from the past 24 hour(s)).   Assessment & Plan:  1) Low-risk pregnancy G1P0000 at [redacted]w[redacted]d with an Estimated Date of Delivery: 02/16/23     Meds: No orders of the defined types were placed in this encounter.  Labs/procedures today: none  Plan:  Continue routine obstetrical care , []  GBS next visit  Next visit: prefers in person    Reviewed: Preterm labor symptoms and general obstetric precautions including but not limited to vaginal bleeding, contractions, leaking of fluid and fetal movement were reviewed in detail with the patient.  All questions were answered.   Follow-up: Return in about 1 week (around 01/21/2023) for LROB visit.  No orders of the defined types were placed in this encounter.   Myna Hidalgo, DO Attending Obstetrician & Gynecologist, [facility] for [employer], [facility]

## 2023-01-21 ENCOUNTER — Ambulatory Visit (INDEPENDENT_AMBULATORY_CARE_PROVIDER_SITE_OTHER): Payer: Medicare HMO | Admitting: Obstetrics and Gynecology

## 2023-01-21 ENCOUNTER — Other Ambulatory Visit (HOSPITAL_COMMUNITY)
Admission: RE | Admit: 2023-01-21 | Discharge: 2023-01-21 | Disposition: A | Discharge status: Home / Self Care | Payer: Medicare HMO | Source: Ambulatory Visit | Attending: Obstetrics and Gynecology | Admitting: Obstetrics and Gynecology

## 2023-01-21 ENCOUNTER — Encounter: Payer: Self-pay | Admitting: Obstetrics and Gynecology

## 2023-01-21 VITALS — BP 135/74 | HR 90 | Wt 188.6 lb

## 2023-01-21 DIAGNOSIS — Z3403 Encounter for supervision of normal first pregnancy, third trimester: Secondary | ICD-10-CM | POA: Diagnosis present

## 2023-01-21 DIAGNOSIS — Z3A36 36 weeks gestation of pregnancy: Secondary | ICD-10-CM

## 2023-01-21 NOTE — Progress Notes (Signed)
   PRENATAL VISIT NOTE  Subjective:  Desiree Harper is a 27 y.o. G1P0000 at [redacted]w[redacted]d being seen today for ongoing prenatal care.  She is currently monitored for the following issues for this low-risk pregnancy and has Idiopathic scoliosis; Post traumatic stress disorder; Irritable bowel syndrome with diarrhea; Depression, major, single episode, moderate (HCC); GAD (generalized anxiety disorder); Supervision of normal first pregnancy; and Gonorrhea in pregnancy on their problem list.  Patient reports irregular contractions.Denies leaking of fluid, vaginal bleeding  The following portions of the patient's history were reviewed and updated as appropriate: allergies, current medications, past family history, past medical history, past social history, past surgical history and problem list.   Objective:   Vitals:   01/21/23 1004  BP: 135/74  Pulse: 90  Weight: 188 lb 9.6 oz (85.5 kg)    Fetal Status: Fetal Heart Rate (bpm): 148 Fundal Height: 36 cm Movement: Present  Presentation: Vertex  General:  Alert, oriented and cooperative. Patient is in no acute distress.  Skin: Skin is warm and dry. No rash noted.   Cardiovascular: Normal heart rate noted  Respiratory: Normal respiratory effort, no problems with respiration noted  Abdomen: Soft, gravid, appropriate for gestational age.  Pain/Pressure: Present     Pelvic: Cervical exam performed with chaperone present Dilation: Closed Effacement (%): Thick Station: -3  Extremities: Normal range of motion.  Edema: None  Mental Status: Normal mood and affect. Normal behavior. Normal judgment and thought content.   Assessment and Plan:  Pregnancy: G1P0000 at [redacted]w[redacted]d 1. Encounter for supervision of normal first pregnancy in third trimester BP and FHR normal  Feeling regular fetal movement FH appropriate  2. [redacted] weeks gestation of pregnancy Swabs collected today Desired SVE, 0/th/-3 Labor precautions discussed  - Strep Gp B NAA - GC/Chlamydia  probe amp ([place])not at [facility]   Preterm labor symptoms and general obstetric precautions including but not limited to vaginal bleeding, contractions, leaking of fluid and fetal movement were reviewed in detail with the patient. Please refer to After Visit Summary for other counseling recommendations.   Return in one week for routine prenatal care  Future Appointments  Date Time Provider Department Center  01/28/2023 10:10 AM Myna Hidalgo, DO [facility]  02/04/2023 10:30 AM Jacklyn Shell, CNM [facility]  02/11/2023 10:30 AM Jacklyn Shell, CNM [facility]  02/18/2023 10:10 AM Cheral Marker, CNM [facility]    Albertine Grates, FNP

## 2023-01-25 ENCOUNTER — Encounter: Payer: Self-pay | Admitting: Obstetrics & Gynecology

## 2023-01-27 ENCOUNTER — Encounter: Payer: Self-pay | Admitting: Advanced Practice Midwife

## 2023-01-27 ENCOUNTER — Ambulatory Visit (INDEPENDENT_AMBULATORY_CARE_PROVIDER_SITE_OTHER): Payer: Medicare HMO | Admitting: Advanced Practice Midwife

## 2023-01-27 VITALS — BP 127/76 | HR 91 | Wt 192.0 lb

## 2023-01-27 DIAGNOSIS — Z3403 Encounter for supervision of normal first pregnancy, third trimester: Secondary | ICD-10-CM

## 2023-01-27 DIAGNOSIS — R030 Elevated blood-pressure reading, without diagnosis of hypertension: Secondary | ICD-10-CM

## 2023-01-27 DIAGNOSIS — Z3A37 37 weeks gestation of pregnancy: Secondary | ICD-10-CM

## 2023-01-27 LAB — POCT URINALYSIS DIPSTICK OB
Blood, UA: NEGATIVE
Glucose, UA: NEGATIVE
Ketones, UA: NEGATIVE
Leukocytes, UA: NEGATIVE
Nitrite, UA: NEGATIVE
POC,PROTEIN,UA: NEGATIVE

## 2023-01-27 NOTE — Progress Notes (Signed)
   LOW-RISK PREGNANCY VISIT Patient name: Desiree Harper MRN [id number]  Date of birth: 1996/01/22 Chief Complaint:   Routine Prenatal Visit  History of Present Illness:   Desiree Harper is a 27 y.o. G38P0000 female at [redacted]w[redacted]d with an Estimated Date of Delivery: 02/16/23 being seen today for ongoing management of a low-risk pregnancy.  Today she reports no complaints. Contractions: Not present. Vag. Bleeding: None.  Movement: Present. denies leaking of fluid. Review of Systems:   Pertinent items are noted in HPI Denies abnormal vaginal discharge w/ itching/odor/irritation, headaches, visual changes, shortness of breath, chest pain, abdominal pain, severe nausea/vomiting, or problems with urination or bowel movements unless otherwise stated above. Pertinent History Reviewed:  Reviewed past medical,surgical, social, obstetrical and family history.  Reviewed problem list, medications and allergies. Physical Assessment:   Vitals:   01/27/23 1601 01/27/23 1606  BP: (!) 143/87 127/76  Pulse: 89 91  Weight: 192 lb (87.1 kg)   Body mass index is 31.95 kg/m.        Physical Examination:   General appearance: Well appearing, and in no distress  Mental status: Alert, oriented to person, place, and time  Skin: Warm & dry  Cardiovascular: Normal heart rate noted  Respiratory: Normal respiratory effort, no distress  Abdomen: Soft, gravid, nontender  Pelvic: Cervical exam performed  Dilation: Closed Effacement (%): Thick Station: -3  Extremities: Edema: Moderate pitting, indentation subsides rapidly  Fetal Status: Fetal Heart Rate (bpm): 145 Fundal Height: 37 cm Movement: Present Presentation: Vertex  Results for orders placed or performed in visit on 01/27/23 (from the past 24 hour(s))  POC Urinalysis Dipstick OB   Collection Time: 01/27/23  4:17 PM  Result Value Ref Range   Color, UA     Clarity, UA     Glucose, UA Negative Negative   Bilirubin, UA     Ketones, UA negative    Spec  Grav, UA     Blood, UA negative    pH, UA     POC,PROTEIN,UA Negative Negative, Trace, Small (1+), Moderate (2+), Large (3+), 4+   Urobilinogen, UA     Nitrite, UA negative    Leukocytes, UA Negative Negative   Appearance     Odor      Assessment & Plan:  1) Low-risk pregnancy G1P0000 at [redacted]w[redacted]d with an Estimated Date of Delivery: 02/16/23   2) First BP ^, second nl, denies s/s pre-e; has been getting values at home 120/70s; neg proteinuria; given precautions and will have RN BP check on 01/29/23   Meds: No orders of the defined types were placed in this encounter.  Labs/procedures today: UA; SVE  Plan:  Continue routine obstetrical care   Reviewed: Term labor symptoms and general obstetric precautions including but not limited to vaginal bleeding, contractions, leaking of fluid and fetal movement were reviewed in detail with the patient.  All questions were answered. Has home bp cuff. Check bp weekly, let us know if >140/90.   Follow-up: Return for RN BP check 8/9.  Orders Placed This Encounter  Procedures   POC Urinalysis Dipstick OB   [name] [facility] 01/27/2023 4:19 PM

## 2023-01-28 ENCOUNTER — Encounter: Payer: Medicare HMO | Admitting: Obstetrics & Gynecology

## 2023-01-28 ENCOUNTER — Encounter: Payer: Self-pay | Admitting: Obstetrics & Gynecology

## 2023-01-28 ENCOUNTER — Ambulatory Visit (INDEPENDENT_AMBULATORY_CARE_PROVIDER_SITE_OTHER): Payer: Medicare HMO | Admitting: Obstetrics & Gynecology

## 2023-01-28 VITALS — BP 135/81 | HR 84

## 2023-01-28 DIAGNOSIS — Z3A37 37 weeks gestation of pregnancy: Secondary | ICD-10-CM

## 2023-01-28 DIAGNOSIS — Z3403 Encounter for supervision of normal first pregnancy, third trimester: Secondary | ICD-10-CM | POA: Diagnosis not present

## 2023-01-28 NOTE — Progress Notes (Signed)
LOW-RISK PREGNANCY VISIT Patient name: Desiree Harper MRN [id number]  Date of birth: July 22, 1995 Chief Complaint:   Decreased Fetal Movement  History of Present Illness:   Desiree Harper is a 27 y.o. G42P0000 female at [redacted]w[redacted]d with an Estimated Date of Delivery: 02/16/23 being seen today for ongoing management of a low-risk pregnancy.      08/12/2022   10:35 AM  Depression screen PHQ 2/9  Decreased Interest 0  Down, Depressed, Hopeless 0  PHQ - 2 Score 0  Altered sleeping 0  Tired, decreased energy 0  Change in appetite 0  Feeling bad or failure about yourself  0  Trouble concentrating 0  Moving slowly or fidgety/restless 0  Suicidal thoughts 0  PHQ-9 Score 0    Pt noting mild lower abdominal cramping/discomfort that comes and goes.  Also feeling movement, but not as much. Contractions: Irregular. Vag. Bleeding: None.  Movement: (!) Decreased. denies leaking of fluid. Review of Systems:   Pertinent items are noted in HPI Denies abnormal vaginal discharge w/ itching/odor/irritation, headaches, visual changes, shortness of breath, chest pain, abdominal pain, severe nausea/vomiting, or problems with urination or bowel movements unless otherwise stated above. Pertinent History Reviewed:  Reviewed past medical,surgical, social, obstetrical and family history.  Reviewed problem list, medications and allergies.  Physical Assessment:   Vitals:   01/28/23 1331  BP: 135/81  Pulse: 84  There is no height or weight on file to calculate BMI.        Physical Examination:   General appearance: Well appearing, and in no distress  Mental status: Alert, oriented to person, place, and time  Skin: Warm & dry  Respiratory: Normal respiratory effort, no distress  Abdomen: Soft, gravid, nontender  Pelvic: Cervical exam performed  Dilation: Closed Effacement (%): Thick Station: -3  Extremities:    Psych:  mood and affect appropriate  Fetal Status:     Movement: (!) Decreased    NST being  performed due to decreased movement   Fetal Monitoring:  Baseline: 135 bpm, Variability: moderate, Accelerations: present, The accelerations are >15 bpm and more than 2 in 20 minutes, and Decelerations: Absent     Final diagnosis:   Reactive NST     Chaperone:  pt declined     Results for orders placed or performed in visit on 01/27/23 (from the past 24 hour(s))  POC Urinalysis Dipstick OB   Collection Time: 01/27/23  4:17 PM  Result Value Ref Range   Color, UA     Clarity, UA     Glucose, UA Negative Negative   Bilirubin, UA     Ketones, UA negative    Spec Grav, UA     Blood, UA negative    pH, UA     POC,PROTEIN,UA Negative Negative, Trace, Small (1+), Moderate (2+), Large (3+), 4+   Urobilinogen, UA     Nitrite, UA negative    Leukocytes, UA Negative Negative   Appearance     Odor       Assessment & Plan:  1) Low-risk pregnancy G1P0000 at [redacted]w[redacted]d with an Estimated Date of Delivery: 02/16/23   -reactive NST -suspect pt starting to feeling BH/early labor   Meds: No orders of the defined types were placed in this encounter.  Labs/procedures today: NST  Plan:  Continue routine obstetrical care  Next visit: prefers in person    Reviewed: Term labor symptoms and general obstetric precautions including but not limited to vaginal bleeding, contractions, leaking of fluid and fetal  movement were reviewed in detail with the patient.  All questions were answered.  Follow-up:  as scheduled No orders of the defined types were placed in this encounter.   Myna Hidalgo, DO Attending Obstetrician & Gynecologist, [facility] for [employer], [facility]

## 2023-01-29 ENCOUNTER — Telehealth (INDEPENDENT_AMBULATORY_CARE_PROVIDER_SITE_OTHER): Payer: Medicare HMO | Admitting: *Deleted

## 2023-01-29 VITALS — BP 117/84

## 2023-01-29 DIAGNOSIS — O98213 Gonorrhea complicating pregnancy, third trimester: Secondary | ICD-10-CM

## 2023-01-29 DIAGNOSIS — Z3403 Encounter for supervision of normal first pregnancy, third trimester: Secondary | ICD-10-CM

## 2023-01-29 DIAGNOSIS — Z0130 Encounter for examination of blood pressure without abnormal findings: Secondary | ICD-10-CM

## 2023-01-29 NOTE — Progress Notes (Signed)
   NURSE VISIT- BLOOD PRESSURE CHECK  I connected with [name] on 01/29/2023 by telephone  and verified that I am speaking with the correct person using two identifiers.   I discussed the limitations of evaluation and management by telemedicine. The patient expressed understanding and agreed to proceed.  Nurse is at the office, and patient is at home.  SUBJECTIVE:  Desiree Harper is a 27 y.o. G25P0000 female here for BP check. She is [redacted]w[redacted]d pregnant    HYPERTENSION ROS:  Pregnant:  Severe headaches that don't go away with tylenol/other medicines: No  Visual changes (seeing spots/double/blurred vision) No  Severe pain under right breast breast or in center of upper chest No  Severe nausea/vomiting No  Taking medicines as instructed not applicable    OBJECTIVE:  BP 117/84   LMP 04/14/2022 (Exact Date)   Appearance alert, well appearing, and in no distress.  ASSESSMENT: Pregnancy [redacted]w[redacted]d  blood pressure check  PLAN: Discussed with Dr. Charlotta Newton   Recommendations: no changes needed   Follow-up: as scheduled   Jobe Marker  01/29/2023 10:33 AM

## 2023-02-04 ENCOUNTER — Ambulatory Visit (INDEPENDENT_AMBULATORY_CARE_PROVIDER_SITE_OTHER): Payer: Medicare HMO | Admitting: Advanced Practice Midwife

## 2023-02-04 ENCOUNTER — Encounter: Payer: Self-pay | Admitting: Advanced Practice Midwife

## 2023-02-04 VITALS — BP 123/80 | HR 100 | Wt 193.0 lb

## 2023-02-04 DIAGNOSIS — Z3A38 38 weeks gestation of pregnancy: Secondary | ICD-10-CM

## 2023-02-04 DIAGNOSIS — Z3403 Encounter for supervision of normal first pregnancy, third trimester: Secondary | ICD-10-CM

## 2023-02-04 NOTE — Patient Instructions (Signed)

## 2023-02-04 NOTE — Progress Notes (Signed)
   LOW-RISK PREGNANCY VISIT Patient name: Desiree Harper MRN [id number]  Date of birth: 04-16-1996 Chief Complaint:   Routine Prenatal Visit (Cervical check)  History of Present Illness:   Desiree Harper is a 27 y.o. G14P0000 female at [redacted]w[redacted]d with an Estimated Date of Delivery: 02/16/23 being seen today for ongoing management of a low-risk pregnancy.  Today she reports no complaints. Contractions: Irregular.  .  Movement: Present. denies leaking of fluid. Review of Systems:   Pertinent items are noted in HPI Denies abnormal vaginal discharge w/ itching/odor/irritation, headaches, visual changes, shortness of breath, chest pain, abdominal pain, severe nausea/vomiting, or problems with urination or bowel movements unless otherwise stated above. Pertinent History Reviewed:  Reviewed past medical,surgical, social, obstetrical and family history.  Reviewed problem list, medications and allergies. Physical Assessment:   Vitals:   02/04/23 1044  BP: 123/80  Pulse: 100  Weight: 193 lb (87.5 kg)  Body mass index is 32.12 kg/m.        Physical Examination:   General appearance: Well appearing, and in no distress  Mental status: Alert, oriented to person, place, and time  Skin: Warm & dry  Cardiovascular: Normal heart rate noted  Respiratory: Normal respiratory effort, no distress  Abdomen: Soft, gravid, nontender  Pelvic: Cervical exam performed  Dilation: 1.5 Effacement (%): 50 Station: -2  Extremities: Edema: Mild pitting, slight indentation  Fetal Status: Fetal Heart Rate (bpm): 142 Fundal Height: 38 cm Movement: Present Presentation: Vertex  Chaperone:  Peggy Dones    No results found for this or any previous visit (from the past 24 hour(s)).  Assessment & Plan:    Pregnancy: G1P0000 at [redacted]w[redacted]d 1. [redacted] weeks gestation of pregnancy   2. Encounter for supervision of normal first pregnancy in third trimester      Meds: No orders of the defined types were placed in this  encounter.  Labs/procedures today:   Plan:  Continue routine obstetrical care  Next visit: prefers in person    Reviewed: Term labor symptoms and general obstetric precautions including but not limited to vaginal bleeding, contractions, leaking of fluid and fetal movement were reviewed in detail with the patient.  All questions were answered. Has home bp cuff.. Check bp weekly, let us know if >140/90.   Follow-up: Return for As scheduled.  Future Appointments  Date Time Provider Department Center  02/11/2023 10:30 AM Jacklyn Shell, [place] [facility]  02/18/2023 10:10 AM Cheral Marker, CNM [facility]    No orders of the defined types were placed in this encounter.  Jacklyn Shell DNP, CNM 02/04/2023 11:11 AM

## 2023-02-11 ENCOUNTER — Ambulatory Visit (INDEPENDENT_AMBULATORY_CARE_PROVIDER_SITE_OTHER): Payer: Medicare HMO | Admitting: Advanced Practice Midwife

## 2023-02-11 ENCOUNTER — Encounter: Payer: Self-pay | Admitting: Advanced Practice Midwife

## 2023-02-11 VITALS — BP 128/83 | HR 96 | Wt 198.0 lb

## 2023-02-11 DIAGNOSIS — Z3A39 39 weeks gestation of pregnancy: Secondary | ICD-10-CM

## 2023-02-11 DIAGNOSIS — Z3403 Encounter for supervision of normal first pregnancy, third trimester: Secondary | ICD-10-CM

## 2023-02-11 NOTE — Progress Notes (Signed)
   LOW-RISK PREGNANCY VISIT Patient name: Desiree Harper MRN [id number]  Date of birth: 18-Mar-1996 Chief Complaint:   Routine Prenatal Visit  History of Present Illness:   Desiree Harper is a 27 y.o. G52P0000 female at [redacted]w[redacted]d with an Estimated Date of Delivery: 02/16/23 being seen today for ongoing management of a low-risk pregnancy.  Today she reports no complaints. Contractions: Irregular. Vag. Bleeding: None.  Movement: Present. denies leaking of fluid. Review of Systems:   Pertinent items are noted in HPI Denies abnormal vaginal discharge w/ itching/odor/irritation, headaches, visual changes, shortness of breath, chest pain, abdominal pain, severe nausea/vomiting, or problems with urination or bowel movements unless otherwise stated above. Pertinent History Reviewed:  Reviewed past medical,surgical, social, obstetrical and family history.  Reviewed problem list, medications and allergies. Physical Assessment:   Vitals:   02/11/23 1026 02/11/23 1027  BP: 128/83 128/83  Pulse: 94 96  Weight: 198 lb (89.8 kg)   Body mass index is 32.95 kg/m.        Physical Examination:   General appearance: Well appearing, and in no distress  Mental status: Alert, oriented to person, place, and time  Skin: Warm & dry  Cardiovascular: Normal heart rate noted  Respiratory: Normal respiratory effort, no distress  Abdomen: Soft, gravid, nontender  Pelvic: Cervical exam performed  Dilation: 1.5 Effacement (%): 50 Station: -2  Extremities: Edema: Mild pitting, slight indentation  Fetal Status: Fetal Heart Rate (bpm): 127 Fundal Height: 38 cm Movement: Present Presentation: Vertex  Chaperone:   Sherri, RN     No results found for this or any previous visit (from the past 24 hour(s)).  Assessment & Plan:    Pregnancy: G1P0000 at [redacted]w[redacted]d 1. [redacted] weeks gestation of pregnancy   2. Encounter for supervision of normal first pregnancy in third trimester      Meds: No orders of the defined types  were placed in this encounter.  Labs/procedures today: membrane sweep  Plan:  Continue routine obstetrical care  Next visit: prefers in person    Reviewed: Term labor symptoms and general obstetric precautions including but not limited to vaginal bleeding, contractions, leaking of fluid and fetal movement were reviewed in detail with the patient.  All questions were answered. Has home bp cuff.. Check bp weekly, let us know if >140/90.   Follow-up: No follow-ups on file.  Future Appointments  Date Time Provider Department Center  02/18/2023 10:10 AM Cheral Marker, CNM [facility]    No orders of the defined types were placed in this encounter.  Jacklyn Shell DNP, CNM 02/11/2023 11:00 AM

## 2023-02-12 ENCOUNTER — Encounter: Payer: Self-pay | Admitting: Advanced Practice Midwife

## 2023-02-15 ENCOUNTER — Other Ambulatory Visit: Payer: Self-pay | Admitting: Women's Health

## 2023-02-15 DIAGNOSIS — O480 Post-term pregnancy: Secondary | ICD-10-CM

## 2023-02-15 NOTE — Progress Notes (Signed)
Pt requests (via MyChart message) eIOL 8/28, discussed elective IOL process, potential for c/s, long IOL process (G1). Placed on eIOL list for 8/28 and postdates IOL scheduled for 9/3. Form faxed and orders placed.  Cheral Marker, CNM, [facility] 02/15/2023 2:46 PM

## 2023-02-16 ENCOUNTER — Telehealth (HOSPITAL_COMMUNITY): Payer: Self-pay | Admitting: *Deleted

## 2023-02-16 ENCOUNTER — Encounter (HOSPITAL_COMMUNITY): Payer: Self-pay | Admitting: *Deleted

## 2023-02-16 NOTE — Telephone Encounter (Signed)
Preadmission screen  

## 2023-02-17 ENCOUNTER — Encounter (HOSPITAL_COMMUNITY): Payer: Self-pay | Admitting: Obstetrics & Gynecology

## 2023-02-17 ENCOUNTER — Inpatient Hospital Stay (HOSPITAL_COMMUNITY): Payer: Medicare HMO | Admitting: Anesthesiology

## 2023-02-17 ENCOUNTER — Other Ambulatory Visit: Payer: Self-pay

## 2023-02-17 ENCOUNTER — Inpatient Hospital Stay (HOSPITAL_COMMUNITY)
Admission: RE | Admit: 2023-02-17 | Discharge: 2023-02-19 | DRG: 807 | Disposition: A | Discharge status: Home / Self Care | Payer: Medicare HMO | Attending: Family Medicine | Admitting: Family Medicine

## 2023-02-17 DIAGNOSIS — Z87891 Personal history of nicotine dependence: Secondary | ICD-10-CM

## 2023-02-17 DIAGNOSIS — F321 Major depressive disorder, single episode, moderate: Secondary | ICD-10-CM | POA: Diagnosis present

## 2023-02-17 DIAGNOSIS — Z3403 Encounter for supervision of normal first pregnancy, third trimester: Principal | ICD-10-CM

## 2023-02-17 DIAGNOSIS — O98213 Gonorrhea complicating pregnancy, third trimester: Secondary | ICD-10-CM

## 2023-02-17 DIAGNOSIS — O99344 Other mental disorders complicating childbirth: Secondary | ICD-10-CM | POA: Diagnosis not present

## 2023-02-17 DIAGNOSIS — Z308 Encounter for other contraceptive management: Secondary | ICD-10-CM

## 2023-02-17 DIAGNOSIS — O480 Post-term pregnancy: Principal | ICD-10-CM | POA: Diagnosis present

## 2023-02-17 DIAGNOSIS — F4310 Post-traumatic stress disorder, unspecified: Secondary | ICD-10-CM | POA: Diagnosis present

## 2023-02-17 DIAGNOSIS — Z30017 Encounter for initial prescription of implantable subdermal contraceptive: Secondary | ICD-10-CM

## 2023-02-17 DIAGNOSIS — Z3A40 40 weeks gestation of pregnancy: Secondary | ICD-10-CM

## 2023-02-17 DIAGNOSIS — O99214 Obesity complicating childbirth: Secondary | ICD-10-CM | POA: Diagnosis present

## 2023-02-17 DIAGNOSIS — O9822 Gonorrhea complicating childbirth: Secondary | ICD-10-CM | POA: Diagnosis not present

## 2023-02-17 LAB — CBC
HCT: 37.7 % (ref 36.0–46.0)
Hemoglobin: 12.1 g/dL (ref 12.0–15.0)
MCH: 28.1 pg (ref 26.0–34.0)
MCHC: 32.1 g/dL (ref 30.0–36.0)
MCV: 87.7 fL (ref 80.0–100.0)
Platelets: 289 10*3/uL (ref 150–400)
RBC: 4.3 MIL/uL (ref 3.87–5.11)
RDW: 14.3 % (ref 11.5–15.5)
WBC: 14.4 10*3/uL — ABNORMAL HIGH (ref 4.0–10.5)
nRBC: 0 % (ref 0.0–0.2)

## 2023-02-17 LAB — TYPE AND SCREEN
ABO/RH(D): A POS
Antibody Screen: NEGATIVE

## 2023-02-17 LAB — RPR: RPR Ser Ql: NONREACTIVE

## 2023-02-17 MED ORDER — FENTANYL CITRATE (PF) 100 MCG/2ML IJ SOLN
100.0000 ug | INTRAMUSCULAR | Status: DC | PRN
  Administered 2023-02-17 (×2): 100 ug via INTRAVENOUS
  Filled 2023-02-17 (×2): qty 2

## 2023-02-17 MED ORDER — MISOPROSTOL 200 MCG PO TABS
400.0000 ug | ORAL_TABLET | Freq: Once | ORAL | Status: AC
  Administered 2023-02-17: 400 ug via ORAL

## 2023-02-17 MED ORDER — LACTATED RINGERS IV SOLN: 500.0000 mL | INTRAVENOUS | Status: DC | PRN

## 2023-02-17 MED ORDER — MISOPROSTOL 200 MCG PO TABS
ORAL_TABLET | ORAL | Status: AC
  Filled 2023-02-17: qty 4

## 2023-02-17 MED ORDER — SOD CITRATE-CITRIC ACID 500-334 MG/5ML PO SOLN
30.0000 mL | ORAL | Status: DC | PRN
  Administered 2023-02-17: 30 mL via ORAL
  Filled 2023-02-17: qty 30

## 2023-02-17 MED ORDER — PANTOPRAZOLE SODIUM 40 MG IV SOLR
40.0000 mg | Freq: Every day | INTRAVENOUS | Status: DC
  Administered 2023-02-17: 40 mg via INTRAVENOUS
  Filled 2023-02-17: qty 10

## 2023-02-17 MED ORDER — IBUPROFEN 600 MG PO TABS
600.0000 mg | ORAL_TABLET | Freq: Four times a day (QID) | ORAL | Status: DC
  Administered 2023-02-18 (×5): 600 mg via ORAL
  Filled 2023-02-17 (×6): qty 1

## 2023-02-17 MED ORDER — ONDANSETRON HCL 4 MG PO TABS: 4.0000 mg | ORAL_TABLET | ORAL | Status: DC | PRN

## 2023-02-17 MED ORDER — TETANUS-DIPHTH-ACELL PERTUSSIS 5-2.5-18.5 LF-MCG/0.5 IM SUSY: 0.5000 mL | PREFILLED_SYRINGE | Freq: Once | INTRAMUSCULAR | Status: DC

## 2023-02-17 MED ORDER — DIBUCAINE (PERIANAL) 1 % EX OINT: 1.0000 | TOPICAL_OINTMENT | CUTANEOUS | Status: DC | PRN

## 2023-02-17 MED ORDER — EPHEDRINE 5 MG/ML INJ: 10.0000 mg | INTRAVENOUS | Status: DC | PRN

## 2023-02-17 MED ORDER — ACETAMINOPHEN 325 MG PO TABS: 650.0000 mg | ORAL_TABLET | ORAL | Status: DC | PRN

## 2023-02-17 MED ORDER — OXYCODONE-ACETAMINOPHEN 5-325 MG PO TABS: 1.0000 | ORAL_TABLET | ORAL | Status: DC | PRN

## 2023-02-17 MED ORDER — LACTATED RINGERS IV SOLN: 500.0000 mL | Freq: Once | INTRAVENOUS | Status: DC

## 2023-02-17 MED ORDER — PHENYLEPHRINE 80 MCG/ML (10ML) SYRINGE FOR IV PUSH (FOR BLOOD PRESSURE SUPPORT)
80.0000 ug | PREFILLED_SYRINGE | INTRAVENOUS | Status: DC | PRN
  Filled 2023-02-17: qty 10

## 2023-02-17 MED ORDER — SENNOSIDES-DOCUSATE SODIUM 8.6-50 MG PO TABS
2.0000 | ORAL_TABLET | ORAL | Status: DC
  Administered 2023-02-18 (×2): 2 via ORAL
  Filled 2023-02-17 (×2): qty 2

## 2023-02-17 MED ORDER — HYDROXYZINE HCL 50 MG PO TABS: 50.0000 mg | ORAL_TABLET | Freq: Four times a day (QID) | ORAL | Status: DC | PRN

## 2023-02-17 MED ORDER — FLEET ENEMA RE ENEM: 1.0000 | ENEMA | RECTAL | Status: DC | PRN

## 2023-02-17 MED ORDER — PHENYLEPHRINE 80 MCG/ML (10ML) SYRINGE FOR IV PUSH (FOR BLOOD PRESSURE SUPPORT): 80.0000 ug | PREFILLED_SYRINGE | INTRAVENOUS | Status: DC | PRN

## 2023-02-17 MED ORDER — PANTOPRAZOLE SODIUM 40 MG IV SOLR
40.0000 mg | Freq: Once | INTRAVENOUS | Status: AC
  Administered 2023-02-17: 40 mg via INTRAVENOUS
  Filled 2023-02-17: qty 10

## 2023-02-17 MED ORDER — ONDANSETRON HCL 4 MG/2ML IJ SOLN
4.0000 mg | Freq: Four times a day (QID) | INTRAMUSCULAR | Status: DC | PRN
  Administered 2023-02-17 (×2): 4 mg via INTRAVENOUS
  Filled 2023-02-17 (×2): qty 2

## 2023-02-17 MED ORDER — FENTANYL-BUPIVACAINE-NACL 0.5-0.125-0.9 MG/250ML-% EP SOLN
12.0000 mL/h | EPIDURAL | Status: DC | PRN
  Administered 2023-02-17: 12 mL/h via EPIDURAL
  Filled 2023-02-17: qty 250

## 2023-02-17 MED ORDER — SIMETHICONE 80 MG PO CHEW: 80.0000 mg | CHEWABLE_TABLET | ORAL | Status: DC | PRN

## 2023-02-17 MED ORDER — OXYTOCIN-SODIUM CHLORIDE 30-0.9 UT/500ML-% IV SOLN: 2.5000 [IU]/h | INTRAVENOUS | Status: DC

## 2023-02-17 MED ORDER — TERBUTALINE SULFATE 1 MG/ML IJ SOLN: 0.2500 mg | Freq: Once | INTRAMUSCULAR | Status: DC | PRN

## 2023-02-17 MED ORDER — OXYTOCIN-SODIUM CHLORIDE 30-0.9 UT/500ML-% IV SOLN
1.0000 m[IU]/min | INTRAVENOUS | Status: DC
  Administered 2023-02-17: 2 m[IU]/min via INTRAVENOUS
  Filled 2023-02-17: qty 500

## 2023-02-17 MED ORDER — LIDOCAINE HCL (PF) 1 % IJ SOLN
INTRAMUSCULAR | Status: DC | PRN
  Administered 2023-02-17: 4 mL via EPIDURAL
  Administered 2023-02-17: 5 mL via EPIDURAL

## 2023-02-17 MED ORDER — DIPHENHYDRAMINE HCL 25 MG PO CAPS: 25.0000 mg | ORAL_CAPSULE | Freq: Four times a day (QID) | ORAL | Status: DC | PRN

## 2023-02-17 MED ORDER — DIPHENHYDRAMINE HCL 50 MG/ML IJ SOLN
12.5000 mg | INTRAMUSCULAR | Status: DC | PRN
  Administered 2023-02-17: 12.5 mg via INTRAVENOUS
  Filled 2023-02-17: qty 1

## 2023-02-17 MED ORDER — MISOPROSTOL 200 MCG PO TABS: 200.0000 ug | ORAL_TABLET | Freq: Once | ORAL | Status: DC

## 2023-02-17 MED ORDER — LACTATED RINGERS IV SOLN: INTRAVENOUS | Status: DC

## 2023-02-17 MED ORDER — WITCH HAZEL-GLYCERIN EX PADS: 1.0000 | MEDICATED_PAD | CUTANEOUS | Status: DC | PRN

## 2023-02-17 MED ORDER — PRENATAL MULTIVITAMIN CH
1.0000 | ORAL_TABLET | Freq: Every day | ORAL | Status: DC
  Administered 2023-02-18: 1 via ORAL
  Filled 2023-02-17: qty 1

## 2023-02-17 MED ORDER — MEASLES, MUMPS & RUBELLA VAC IJ SOLR: 0.5000 mL | Freq: Once | INTRAMUSCULAR | Status: DC

## 2023-02-17 MED ORDER — BENZOCAINE-MENTHOL 20-0.5 % EX AERO
1.0000 | INHALATION_SPRAY | CUTANEOUS | Status: DC | PRN
  Administered 2023-02-18: 1 via TOPICAL
  Filled 2023-02-17: qty 56

## 2023-02-17 MED ORDER — ZOLPIDEM TARTRATE 5 MG PO TABS: 5.0000 mg | ORAL_TABLET | Freq: Every evening | ORAL | Status: DC | PRN

## 2023-02-17 MED ORDER — MISOPROSTOL 50MCG HALF TABLET
50.0000 ug | ORAL_TABLET | Freq: Once | ORAL | Status: AC
  Administered 2023-02-17: 50 ug via ORAL
  Filled 2023-02-17: qty 1

## 2023-02-17 MED ORDER — MISOPROSTOL 200 MCG PO TABS
400.0000 ug | ORAL_TABLET | Freq: Once | ORAL | Status: AC
  Administered 2023-02-17: 400 ug via RECTAL

## 2023-02-17 MED ORDER — COCONUT OIL OIL: 1.0000 | TOPICAL_OIL | Status: DC | PRN

## 2023-02-17 MED ORDER — OXYCODONE HCL 5 MG PO TABS: 5.0000 mg | ORAL_TABLET | ORAL | Status: DC | PRN

## 2023-02-17 MED ORDER — ONDANSETRON HCL 4 MG/2ML IJ SOLN: 4.0000 mg | INTRAMUSCULAR | Status: DC | PRN

## 2023-02-17 MED ORDER — MISOPROSTOL 25 MCG QUARTER TABLET
25.0000 ug | ORAL_TABLET | Freq: Once | ORAL | Status: AC
  Administered 2023-02-17: 25 ug via VAGINAL
  Filled 2023-02-17: qty 1

## 2023-02-17 MED ORDER — OXYTOCIN BOLUS FROM INFUSION
333.0000 mL | Freq: Once | INTRAVENOUS | Status: AC
  Administered 2023-02-17: 333 mL via INTRAVENOUS

## 2023-02-17 MED ORDER — LIDOCAINE HCL (PF) 1 % IJ SOLN: 30.0000 mL | INTRAMUSCULAR | Status: DC | PRN

## 2023-02-17 MED ORDER — OXYCODONE-ACETAMINOPHEN 5-325 MG PO TABS: 2.0000 | ORAL_TABLET | ORAL | Status: DC | PRN

## 2023-02-17 NOTE — Discharge Summary (Signed)
Postpartum Discharge Summary       Patient Name: Desiree Harper DOB: 11/23/1995 MRN: [id number]  Date of admission: 02/17/2023 Delivery date:02/17/2023 Delivering provider: Cam Hai D Date of discharge: 02/19/2023  Admitting diagnosis: Post-dates pregnancy [O48.0] Intrauterine pregnancy: [redacted]w[redacted]d     Secondary diagnosis:  Principal Problem:   Post-dates pregnancy Active Problems:   Post traumatic stress disorder   Depression, major, single episode, moderate (HCC)   Encounter for other contraceptive management  Additional problems: none    Discharge diagnosis: Term Pregnancy Delivered                                              Post partum procedures: Nexplanon placement Augmentation: AROM, Pitocin, and Cytotec Complications: None  Hospital course: Induction of Labor With Vaginal Delivery   27 y.o. yo G1P1001 at [redacted]w[redacted]d was admitted to the hospital 02/17/2023 for induction of labor.  Indication for induction: Elective.  Patient had an uncomplicated labor course. Membrane Rupture Time/Date: 3:11 PM,02/17/2023  Delivery Method:Vaginal, Spontaneous Operative Delivery:N/A Episiotomy: None Lacerations:  None Details of delivery can be found in separate delivery note.  Patient had a postpartum course complicated bynothing. Patient is discharged home 02/19/23.  Newborn Data: Birth date:02/17/2023 Birth time:9:01 PM Gender:Female Living status:Living Apgars:7 ,9  Weight:3110 g (6lb 13.7oz)  Magnesium Sulfate received: No BMZ received: No Rhophylac:N/A MMR:N/A T-DaP:Given prenatally Flu: No Transfusion:No  Physical exam  Vitals:   02/18/23 0523 02/18/23 0730 02/18/23 1400 02/19/23 0000  BP: 119/68 123/75 124/73 123/73  Pulse: 71 78 81 76  Resp: 16 16 16 18   Temp: 97.7 F (36.5 C) 98 F (36.7 C) 97.7 F (36.5 C) 97.6 F (36.4 C)  TempSrc: Oral Oral Oral Oral  SpO2: 99%   100%  Weight:      Height:       General: alert, cooperative, and no distress Lochia:  appropriate Uterine Fundus: firm Incision: N/A DVT Evaluation: No evidence of DVT seen on physical exam. Negative Homan's sign. No cords or calf tenderness. No significant calf/ankle edema. Labs: Lab Results  Component Value Date   WBC 14.4 (H) 02/17/2023   HGB 12.1 02/17/2023   HCT 37.7 02/17/2023   MCV 87.7 02/17/2023   PLT 289 02/17/2023      Latest Ref Rng & Units 07/12/2019    4:40 PM  CMP  Glucose 70 - 99 mg/dL 95   BUN 6 - 20 mg/dL 6   Creatinine 5.62 - 1.30 mg/dL 8.65   Sodium 784 - 696 mmol/L 136   Potassium 3.5 - 5.1 mmol/L 3.7   Chloride 98 - 111 mmol/L 102   CO2 22 - 32 mmol/L 24   Calcium 8.9 - 10.3 mg/dL 9.0    Edinburgh Score:    02/17/2023   11:00 PM  Edinburgh Postnatal Depression Scale Screening Tool  I have been able to laugh and see the funny side of things. 0  I have looked forward with enjoyment to things. 0  I have blamed myself unnecessarily when things went wrong. 0  I have been anxious or worried for no good reason. 0  I have felt scared or panicky for no good reason. 0  Things have been getting on top of me. 0  I have been so unhappy that I have had difficulty sleeping. 0  I have felt sad or miserable. 0  I have been so unhappy that I have been crying. 0  The thought of harming myself has occurred to me. 0  Edinburgh Postnatal Depression Scale Total 0     After visit meds:  Allergies as of 02/19/2023       Reactions   Latex Rash        Medication List     STOP taking these medications    Blood Pressure Monitor Misc       TAKE these medications    ibuprofen 600 MG tablet Commonly known as: ADVIL Take 1 tablet (600 mg total) by mouth every 6 (six) hours.   PRENATAL VITAMIN PO Take by mouth.         Discharge home in stable condition Infant Feeding: Breast Infant Disposition:home with mother Discharge instruction: per After Visit Summary and Postpartum booklet. Activity: Advance as tolerated. Pelvic rest for 6  weeks.  Diet: routine diet Future Appointments: Future Appointments  Date Time Provider Department Center  04/01/2023 10:10 AM Jacklyn Shell, CNM [facility]   Follow up Visit:  [name], CNM  [name] Please schedule this patient for Postpartum visit in: 6 weeks with the following provider: Any provider In-Person For C/S patients schedule nurse incision check in weeks 2 weeks: no Low risk pregnancy complicated by: none Delivery mode:  SVD Anticipated Birth Control:  PP Nexplanon placed PP Procedures needed: none Schedule Integrated BH visit: no   02/19/2023 Jacklyn Shell, CNM

## 2023-02-17 NOTE — Progress Notes (Signed)
Desiree Harper is a 27 y.o. G1P0000 at [redacted]w[redacted]d admitted for induction of labor due to Elective at term.  Subjective: Getting comfortable with epidural.   Objective: BP (!) 110/55   Pulse 70   Temp 97.9 F (36.6 C) (Oral)   Resp 16   Ht 5\' 5"  (1.651 m)   Wt 90.9 kg   LMP 04/14/2022 (Exact Date)   SpO2 99%   BMI 33.33 kg/m  No intake/output data recorded. Total I/O In: -  Out: 700 [Urine:700]  FHT:  FHR: 115 bpm, variability: moderate,  accelerations:  Present,  decelerations:  Absent UC:   regular, every 2-4 minutes SVE:   Dilation: 3 Effacement (%): 50 Station: -3 Exam by: A. Thorburn, RN  Labs: Lab Results  Component Value Date   WBC 14.4 (H) 02/17/2023   HGB 12.1 02/17/2023   HCT 37.7 02/17/2023   MCV 87.7 02/17/2023   PLT 289 02/17/2023    Assessment / Plan: Induction of labor due to term with favorable cervix,  progressing well on pitocin  Labor: Progressing on Pitocin, will continue to increase, s/p AROM x 2 hrs Preeclampsia:   n/a Fetal Wellbeing:  Category I Pain Control:  Epidural I/D:  n/a Anticipated MOD:  NSVD  Raelyn Mora, CNM 02/17/2023, 9:50 AM
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
## 2023-02-17 NOTE — Progress Notes (Signed)
Desiree Harper is a 27 y.o. G1P0000 at [redacted]w[redacted]d admitted for induction of labor due to Elective at term.  Subjective: Just woke up from a nap. Ready for AROM. Husband is supportive at beside.  Objective: BP 112/63   Pulse 81   Temp 98.4 F (36.9 C) (Oral)   Resp 16   Ht 5\' 5"  (1.651 m)   Wt 90.9 kg   LMP 04/14/2022 (Exact Date)   SpO2 99%   BMI 33.33 kg/m  No intake/output data recorded. Total I/O In: -  Out: 700 [Urine:700]  FHT:  FHR: 120 bpm, variability: moderate,  accelerations:  Present,  decelerations:  Present variable UC:   regular, every 3-4 minutes SVE:   Dilation: 4 Effacement (%): 100 Station: -1 Exam by:: Carloyn Jaeger CNM AROM Procedure: Verbal consent to proceed with AROM after discussion of r/b and active management of labor. Large amount of clear fluid in return. Patient verbalized an understanding of the plan of care and agrees. Patient tolerated the procedure well.  Labs: Lab Results  Component Value Date   WBC 14.4 (H) 02/17/2023   HGB 12.1 02/17/2023   HCT 37.7 02/17/2023   MCV 87.7 02/17/2023   PLT 289 02/17/2023    Assessment / Plan: Induction of labor due to term with favorable cervix,  progressing well on pitocin  Labor: Progressing on Pitocin, will continue to increase  Preeclampsia:   n/a Fetal Wellbeing:  Category I Pain Control:  Epidural I/D:  n/a Anticipated MOD:  NSVD  Raelyn Mora, CNM 02/17/2023, 4:18 PM

## 2023-02-17 NOTE — Anesthesia Procedure Notes (Signed)
Epidural Patient location during procedure: OB Start time: 02/17/2023 9:17 AM End time: 02/17/2023 9:20 AM  Staffing Anesthesiologist: Beryle Lathe, MD Performed: anesthesiologist   Preanesthetic Checklist Completed: patient identified, IV checked, risks and benefits discussed, monitors and equipment checked, pre-op evaluation and timeout performed  Epidural Patient position: sitting Prep: DuraPrep Patient monitoring: continuous pulse ox and blood pressure Approach: midline Location: L2-L3 Injection technique: LOR saline  Needle:  Needle type: Tuohy  Needle gauge: 17 G Needle length: 9 cm Needle insertion depth: 6 and 11 cm Catheter size: 19 Gauge Catheter at skin depth: 11 cm Test dose: negative and Other (1% lidocaine)  Assessment Events: blood not aspirated and no cerebrospinal fluid  Additional Notes Patient identified. Risks including, but not limited to, bleeding, infection, nerve damage, paralysis, inadequate analgesia, blood pressure changes, nausea, vomiting, allergic reaction, postpartum back pain, itching, and headache were discussed. Patient expressed understanding and wished to proceed. Sterile prep and drape, including hand hygiene, mask, and sterile gloves were used. The patient was positioned and the spine was prepped. The skin was anesthetized with lidocaine. No paraesthesia or other complication noted. The patient did not experience any signs of intravascular injection such as tinnitus or metallic taste in mouth, nor signs of intrathecal spread such as rapid motor block. Please see nursing notes for vital signs. The patient tolerated the procedure well.   Leslye Peer, MDReason for block:procedure for pain

## 2023-02-17 NOTE — Anesthesia Preprocedure Evaluation (Addendum)
Anesthesia Evaluation  Patient identified by MRN, date of birth, ID band Patient awake    Reviewed: Allergy & Precautions, NPO status , Patient's Chart, lab work & pertinent test results  History of Anesthesia Complications Negative for: history of anesthetic complications  Airway Mallampati: II   Neck ROM: Full    Dental   Pulmonary former smoker   Pulmonary exam normal        Cardiovascular negative cardio ROS Normal cardiovascular exam     Neuro/Psych  PSYCHIATRIC DISORDERS Anxiety Depression    negative neurological ROS     GI/Hepatic Neg liver ROS,,, IBS    Endo/Other   Obesity   Renal/GU negative Renal ROS     Musculoskeletal  Scoliosis    Abdominal   Peds  Hematology negative hematology ROS (+)  Plt 289k    Anesthesia Other Findings   Reproductive/Obstetrics (+) Pregnancy                             Anesthesia Physical Anesthesia Plan  ASA: 2  Anesthesia Plan: Epidural   Post-op Pain Management: Minimal or no pain anticipated   Induction:   PONV Risk Score and Plan: 2 and Treatment may vary due to age or medical condition  Airway Management Planned: Natural Airway  Additional Equipment: None  Intra-op Plan:   Post-operative Plan:   Informed Consent: I have reviewed the patients History and Physical, chart, labs and discussed the procedure including the risks, benefits and alternatives for the proposed anesthesia with the patient or authorized representative who has indicated his/her understanding and acceptance.       Plan Discussed with: Anesthesiologist  Anesthesia Plan Comments: (Labs reviewed. Platelets acceptable, patient not taking any blood thinning medications. Per RN, FHR tracing reported to be stable enough for sitting procedure. Risks and benefits discussed with patient, including PDPH, backache, epidural hematoma, failed epidural, blood  pressure changes, allergic reaction, and nerve injury. Patient expressed understanding and wished to proceed.)       Anesthesia Quick Evaluation

## 2023-02-17 NOTE — Progress Notes (Signed)
LABOR PROGRESS NOTE  Desiree Harper is a 27 y.o. G1P0000 at [redacted]w[redacted]d presented for IOL for dates.  S: Feeling painful ctx. Getting 2nd dose of fentanyl.  O:  BP 132/84   Pulse 91   Temp 97.9 F (36.6 C) (Oral)   Resp 18   Ht 5\' 5"  (1.651 m)   Wt 90.9 kg   LMP 04/14/2022 (Exact Date)   BMI 33.33 kg/m  EFM:125 bpm/Moderate variability/ 15x15 accels/ None decels CAT: 1 Toco: regular, every 2-3 minutes   CVE: Dilation: 3 Effacement (%): 50 Cervical Position: Posterior Station: -3 Presentation: Vertex Exam by:: Dr. Earlene Plater   A&P: 27 y.o. G1P0000 [redacted]w[redacted]d here for IOL as above  #Labor: Progressing on her own after 1 dose of cytotec. Will start pitocin to augment. AROM when able #Pain: Epidural PRN #FWB: CAT 1 #GBS negative  Joanne Gavel, MD FMOB Fellow, Faculty practice [facility], Center for [facility] 02/17/23  7:58 AM

## 2023-02-18 ENCOUNTER — Encounter: Payer: Medicare HMO | Admitting: Women's Health

## 2023-02-18 DIAGNOSIS — Z308 Encounter for other contraceptive management: Secondary | ICD-10-CM

## 2023-02-18 DIAGNOSIS — Z30017 Encounter for initial prescription of implantable subdermal contraceptive: Secondary | ICD-10-CM

## 2023-02-18 MED ORDER — ETONOGESTREL 68 MG ~~LOC~~ IMPL
68.0000 mg | DRUG_IMPLANT | Freq: Once | SUBCUTANEOUS | Status: AC
  Administered 2023-02-18: 68 mg via SUBCUTANEOUS
  Filled 2023-02-18: qty 1

## 2023-02-18 MED ORDER — LIDOCAINE HCL 1 % IJ SOLN
0.0000 mL | Freq: Once | INTRAMUSCULAR | Status: AC | PRN
  Administered 2023-02-18: 20 mL via INTRADERMAL
  Filled 2023-02-18: qty 20

## 2023-02-18 NOTE — Progress Notes (Signed)
Post Partum Day #1 Subjective: no complaints, up ad lib, and tolerating PO; breastfeeding going well; she expresses a desire to have her son circumcised- she was consented and a note placed on his chart; she would like an inpatient Nexplanon placed- she has used this before for contraception  Objective: Blood pressure 119/68, pulse 71, temperature 97.7 F (36.5 C), temperature source Oral, resp. rate 16, height 5\' 5"  (1.651 m), weight 90.9 kg, last menstrual period 04/14/2022, SpO2 99%, unknown if currently breastfeeding.  Physical Exam:  General: alert, cooperative, and no distress Lochia: appropriate Uterine Fundus: firm DVT Evaluation: No evidence of DVT seen on physical exam.  Recent Labs    02/17/23 0051  HGB 12.1  HCT 37.7    Assessment/Plan: Plan for discharge tomorrow and Circumcision prior to discharge Nexplanon ordered, to be placed prior to d/c   LOS: 1 day   [name], CNM 02/18/2023, 7:23 AM

## 2023-02-18 NOTE — Lactation Note (Signed)
This note was copied from a baby's chart. Lactation Consultation Note  Patient Name: Desiree Harper [id number] Date: 02/18/2023 Age:25 hours Reason for consult: Initial assessment;1st time breastfeeding;Term;Primapara;Mother's request  Visited P1 parent for initial assessment of a term baby. Birth parent has Medicaid and Medicare and does not qualify for a stork pump. Birth parent has a [facility] appointment on Tuesday 9/3. Birth parent reports no breast changes during pregnancy aside from potentially darkened areolas.   Breasts are slightly tubular. Easily expressed colostrum and everted nipples on both sides. Baby had not fed since 5AM, so we got baby latched with ease. LATCH score: 9, birth parent reports this is not how he has usually been feeding and that baby will latch on but will not suckle/feed. Birth parent reports hx of oral restrictions in family, however baby breastfed well this feeding session and was still feeding upon exit.  D/t birth parent not having a pump at home and not qualifying for stork, [facility] set up manual pump for birth parent and instructed on use. LC reviewed DEBP as well as pumping and milk storage guidelines and normal newborn behavior.   Feeding Plan:  1) Breastfeed baby skin to skin every 2-3 hours, or sooner on demand. 2) Call RN for breastfeeding assistance.  Maternal Data Has patient been taught Hand Expression?: Yes Does the patient have breastfeeding experience prior to this delivery?: No  Feeding Mother's Current Feeding Choice: Breast Milk  LATCH Score Latch: Grasps breast easily, tongue down, lips flanged, rhythmical sucking.  Audible Swallowing: A few with stimulation  Type of Nipple: Everted at rest and after stimulation  Comfort (Breast/Nipple): Soft / non-tender  Hold (Positioning): Assistance needed to correctly position infant at breast and maintain latch.  LATCH Score: 8   Lactation Tools Discussed/Used Tools: Pump;Flanges Flange Size:  21 Breast pump type: Double-Electric Breast Pump;Manual Pump Education: Setup, frequency, and cleaning;Milk Storage Reason for Pumping: Establish milk supply, parent requested.  Interventions Interventions: Breast feeding basics reviewed;Assisted with latch;Skin to skin;Hand express;Breast compression;Adjust position;Support pillows;Position options;Hand pump;DEBP;LC Services brochure;CDC Guidelines for Breast Pump Cleaning  Discharge Pump: Manual WIC Program: Yes  Consult Status Consult Status: Follow-up Date: 02/19/23 Follow-up type: In-patient    Antionette Char 02/18/2023, 12:46 PM

## 2023-02-18 NOTE — Procedures (Signed)
Post-Placental Nexplanon Insertion Procedure Note  Patient was identified. Informed consent was signed, signed copy in chart. A time-out was performed.    The insertion site was identified 8-10 cm (3-4 inches) from the medial epicondyle of the humerus and 3-5 cm (1.25-2 inches) posterior to (below) the sulcus (groove) between the biceps and triceps muscles of the patient's left arm and marked. The site was prepped and draped in the usual sterile fashion. Pt was prepped with alcohol swab and then injected with 2 cc of 1% lidocaine. The site was prepped with betadine. Nexplanon removed form packaging,  Device confirmed in needle, then inserted full length of needle and withdrawn per handbook instructions. Provider and patient verified presence of the implant in the woman's arm by palpation. Pt insertion site was covered with steristrips/adhesive bandage and pressure bandage. There was minimal blood loss. Patient tolerated procedure well.  Patient was given post procedure instructions and Nexplanon user card with expiration date. Condoms were recommended for STI prevention. Patient was asked to keep the pressure dressing on for 24 hours to minimize bruising and keep the adhesive bandage on for 3-5 days. The patient verbalized understanding of the plan of care and agrees.

## 2023-02-18 NOTE — Social Work (Signed)
MOB was referred for history of depression/anxiety.  * Referral screened out by Clinical Social Worker because none of the following criteria appear to apply:  ~ History of anxiety/depression during this pregnancy, or of post-partum depression following prior delivery.  ~ Diagnosis of anxiety and/or depression within last 3 years OR * MOB's symptoms currently being treated with medication and/or therapy.  Per chart review MOB diagnosed prior to August 2021. Per MOB OB records "mood stable" throughout pregnancy.   Please contact the Clinical Social Worker if needs arise, or by MOB request.  Wende Neighbors, LCSWA Clinical Social Worker [phone]

## 2023-02-18 NOTE — Plan of Care (Signed)
progressing 

## 2023-02-18 NOTE — Anesthesia Postprocedure Evaluation (Signed)
Anesthesia Post Note  Patient: Desiree Harper  Procedure(s) Performed: AN AD HOC LABOR EPIDURAL     Patient location during evaluation: Mother Baby Anesthesia Type: Epidural Level of consciousness: awake Pain management: satisfactory to patient Vital Signs Assessment: post-procedure vital signs reviewed and stable Respiratory status: spontaneous breathing Cardiovascular status: stable Anesthetic complications: no  No notable events documented.  Last Vitals:  Vitals:   02/18/23 0040 02/18/23 0523  BP: 122/66 119/68  Pulse: 98 71  Resp: 16 16  Temp: 37.5 C 36.5 C  SpO2: 98% 99%    Last Pain:  Vitals:   02/18/23 0523  TempSrc: Oral  PainSc: 7    Pain Goal:                   Cephus Shelling

## 2023-02-19 MED ORDER — IBUPROFEN 600 MG PO TABS: 600.0000 mg | ORAL_TABLET | Freq: Four times a day (QID) | ORAL | 0 refills | Status: DC

## 2023-02-19 NOTE — Lactation Note (Signed)
This note was copied from a baby's chart. Lactation Consultation Note  Patient Name: Desiree Harper [id number] Date: 02/19/2023 Age:25 hours  Reason for consult: Follow-up assessment;Primapara;1st time breastfeeding;Term  P1, [redacted]w[redacted]d  Mother receptive to [facility] visit. She reports baby has been sleepy and not interested in latching. She reports she has pumped and unable to express colostrum and has started supplementing with formula.   Assisted mother with latch. "[name]" was sleepy and gagging. Mother states he is still spitting up amniotic fluid. Once he was more alert, baby fed fair at breast for 5 min and mother is going to follow up with formula feeding.  Mother is motivated to breastfeed. Discussed the process of milk production, supply and demand and the importance of breast stimulation and milk removal.   Instructed mother to breastfeed 8-12 times in 24 hours, skin to skin, breast feed before formula feeding.  Pump when baby gets a bottle supplement to establish her milk supply.   Mom made aware of O/P services, breastfeeding support groups, community resources, and our phone # for post-discharge questions.    Maternal Data Has patient been taught Hand Expression?: Yes  Feeding Mother's Current Feeding Choice: Breast Milk and Formula Nipple Type: Slow - flow  LATCH Score Latch: Repeated attempts needed to sustain latch, nipple held in mouth throughout feeding, stimulation needed to elicit sucking reflex.  Audible Swallowing: A few with stimulation  Type of Nipple: Everted at rest and after stimulation  Comfort (Breast/Nipple): Soft / non-tender  Hold (Positioning): Assistance needed to correctly position infant at breast and maintain latch.  LATCH Score: 7   Lactation Tools Discussed/Used Reason for Pumping: stimulate milk production Pumping frequency: 2-3 times since pump was set up Pumped volume: 0 mL  Interventions Interventions: Skin to skin;Assisted with  latch;Breast compression;Education  Discharge Discharge Education: Engorgement and breast care;Warning signs for feeding baby  Consult Status Consult Status: Complete Date: 02/19/23    Omar Person 02/19/2023, 10:15 AM

## 2023-02-23 ENCOUNTER — Inpatient Hospital Stay (HOSPITAL_COMMUNITY): Payer: Medicare HMO

## 2023-02-26 ENCOUNTER — Encounter: Payer: Self-pay | Admitting: Advanced Practice Midwife

## 2023-03-22 ENCOUNTER — Telehealth (HOSPITAL_COMMUNITY): Payer: Self-pay | Admitting: *Deleted

## 2023-03-22 NOTE — Telephone Encounter (Signed)
03/22/2023  Name: Desiree Harper MRN: [id number] DOB: [birth year]  Reason for Call:  Transition of Care Hospital Discharge Call  Contact Status: Patient Contact Status: Complete  Language assistant needed: Interpreter Mode: Interpreter Not Needed        Follow-Up Questions: Do You Have Any Concerns About Your Health As You Heal From Delivery?: Yes What Concerns Do You Have About Your Health?: Still bleeding some.  Says she will stop bleeding and then start again.  Denies saturating pads or passing clots. We reviewed normals for lochia and I advised patient to call provider if either warning sign occurs. Do You Have Any Concerns About Your Infants Health?: Yes What Concerns Do You Have About Your Baby?: Asks what to do about cradle cap.  Reviewed using oil to loosen the buildup on the scalp, then usd shampoo and baby brush from the hospital to wash the hair and scalp.  Edinburgh Postnatal Depression Scale:  In the Past 7 Days: I have been able to laugh and see the funny side of things.: As much as I always could I have looked forward with enjoyment to things.: As much as I ever did I have blamed myself unnecessarily when things went wrong.: No, never I have been anxious or worried for no good reason.: No, not at all I have felt scared or panicky for no good reason.: No, not at all Things have been getting on top of me.: No, I have been coping as well as ever I have been so unhappy that I have had difficulty sleeping.: Not at all I have felt sad or miserable.: No, not at all I have been so unhappy that I have been crying.: Only occasionally The thought of harming myself has occurred to me.: Never Edinburgh Postnatal Depression Scale Total: 1  PHQ2-9 Depression Scale:     Discharge Follow-up: Edinburgh score requires follow up?: No Patient was advised of the following resources:: Breastfeeding Support Group, Support Group  Post-discharge interventions: Reviewed Newborn Safe Sleep  Practices  Salena Saner, RN 03/22/2023 14:58

## 2023-03-29 ENCOUNTER — Ambulatory Visit: Payer: Medicare HMO | Admitting: Women's Health

## 2023-04-01 ENCOUNTER — Ambulatory Visit: Payer: Medicare HMO | Admitting: Advanced Practice Midwife

## 2023-04-09 ENCOUNTER — Ambulatory Visit (INDEPENDENT_AMBULATORY_CARE_PROVIDER_SITE_OTHER): Payer: Medicare HMO | Admitting: Obstetrics and Gynecology

## 2023-04-09 ENCOUNTER — Encounter: Payer: Self-pay | Admitting: Obstetrics and Gynecology

## 2023-04-09 VITALS — BP 122/76 | HR 86 | Ht 66.0 in | Wt 185.5 lb

## 2023-04-09 DIAGNOSIS — Z3202 Encounter for pregnancy test, result negative: Secondary | ICD-10-CM

## 2023-04-09 MED ORDER — PRENATAL 28-0.8 MG PO TABS
1.0000 | ORAL_TABLET | Freq: Every day | ORAL | 12 refills | Status: DC
Start: 2023-04-09 — End: 2024-02-14

## 2023-04-09 NOTE — Progress Notes (Signed)
Post Partum Visit Note  Desiree Harper is a 27 y.o. G76P1001 female who presents for a postpartum visit. She is 7 week postpartum following a normal spontaneous vaginal delivery.  I have fully reviewed the prenatal and intrapartum course. The delivery was at 40 gestational weeks.  Anesthesia: epidural. Postpartum course has been good. Baby is doing well. Baby is feeding by breast and bottle. Bleeding no bleeding. Bowel function is normal. Bladder function is normal. Patient is sexually active. Contraception method is Nexplanon. Postpartum depression screening: negative.   The pregnancy intention screening data noted above was reviewed. Potential methods of contraception were discussed. The patient elected to proceed with nexplanpon   Edinburgh Postnatal Depression Scale - 04/09/23 1046       Edinburgh Postnatal Depression Scale:  In the Past 7 Days   I have been able to laugh and see the funny side of things. 0    I have looked forward with enjoyment to things. 0    I have blamed myself unnecessarily when things went wrong. 2    I have been anxious or worried for no good reason. 1    I have felt scared or panicky for no good reason. 0    Things have been getting on top of me. 0    I have been so unhappy that I have had difficulty sleeping. 0    I have felt sad or miserable. 0    I have been so unhappy that I have been crying. 1    The thought of harming myself has occurred to me. 0    Edinburgh Postnatal Depression Scale Total 4             Health Maintenance Due  Topic Date Due   Medicare Annual Wellness (AWV)  Never done   INFLUENZA VACCINE  01/21/2023   COVID-19 Vaccine (1 - 2023-24 season) Never done    The following portions of the patient's history were reviewed and updated as appropriate: allergies, current medications, past family history, past medical history, past social history, past surgical history, and problem list.  Review of Systems Pertinent items are  noted in HPI.  Objective:  BP 122/76 (BP Location: Left Arm, Patient Position: Sitting, Cuff Size: Normal)   Pulse 86   Ht 5\' 6"  (1.676 m)   Wt 185 lb 8 oz (84.1 kg)   LMP 04/14/2022 (Exact Date)   Breastfeeding Yes   BMI 29.94 kg/m    General:  alert and cooperative   Breasts:  normal  Lungs: Normal effort  Heart:  Normal rate  Abdomen: soft    Wound N/a  GU exam:  not indicated       Assessment:   1. Pregnancy examination or test, negative result  - POCT urine pregnancy  2. Postpartum exam Nexplanon in place  - Prenatal 28-0.8 MG TABS; Take 1 tablet by mouth daily.  Dispense: 30 tablet; Refill: 12   Plan:   Essential components of care per ACOG recommendations:  1.  Mood and well being: Patient with negative depression screening today. Reviewed local resources for support.  - Patient tobacco use? No.   - hx of drug use? No.    2. Infant care and feeding:  -Patient currently breastmilk feeding? yes  -Social determinants of health (SDOH) reviewed in EPIC. No concerns  3. Sexuality, contraception and birth spacing - Patient does not want a pregnancy in the next year.  Desired family size is nusure children.  -  Reviewed reproductive life planning. Reviewed contraceptive methods based on pt preferences and effectiveness.  Patient desired Hormonal Implant today.   - Discussed birth spacing of 18 months  4. Sleep and fatigue -Encouraged family/partner/community support of 4 hrs of uninterrupted sleep to help with mood and fatigue  5. Physical Recovery  - Discussed patients delivery and complications. She describes her labor as good. - Patient had a Vaginal, no problems at delivery. Patient had a  none  laceration. Perineal healing reviewed. Patient expressed understanding - Patient has urinary incontinence? No. - Patient is safe to resume physical and sexual activity  6.  Health Maintenance - HM due items addressed Yes - Last pap smear  Diagnosis  Date Value  Ref Range Status  01/27/2022   Final   - Negative for intraepithelial lesion or malignancy (NILM)   Pap smear not done at today's visit.  -Breast Cancer screening indicated? No.   7. Chronic Disease/Pregnancy Condition follow up: None  - PCP follow up  Albertine Grates, FNP Center for [employer], [facility]

## 2023-07-09 ENCOUNTER — Encounter: Payer: Self-pay | Admitting: Obstetrics & Gynecology

## 2024-01-20 ENCOUNTER — Other Ambulatory Visit: Payer: Self-pay | Admitting: Medical Genetics

## 2024-01-27 ENCOUNTER — Encounter (INDEPENDENT_AMBULATORY_CARE_PROVIDER_SITE_OTHER): Payer: Self-pay | Admitting: *Deleted

## 2024-02-14 ENCOUNTER — Encounter: Payer: Self-pay | Admitting: *Deleted

## 2024-02-14 ENCOUNTER — Ambulatory Visit (INDEPENDENT_AMBULATORY_CARE_PROVIDER_SITE_OTHER): Admitting: Gastroenterology

## 2024-02-14 ENCOUNTER — Encounter (INDEPENDENT_AMBULATORY_CARE_PROVIDER_SITE_OTHER): Payer: Self-pay | Admitting: Gastroenterology

## 2024-02-14 VITALS — BP 122/76 | HR 82 | Temp 98.0°F | Ht 65.5 in | Wt 180.2 lb

## 2024-02-14 DIAGNOSIS — R1013 Epigastric pain: Secondary | ICD-10-CM

## 2024-02-14 DIAGNOSIS — Z6829 Body mass index (BMI) 29.0-29.9, adult: Secondary | ICD-10-CM | POA: Insufficient documentation

## 2024-02-14 DIAGNOSIS — R112 Nausea with vomiting, unspecified: Secondary | ICD-10-CM

## 2024-02-14 DIAGNOSIS — R1112 Projectile vomiting: Secondary | ICD-10-CM | POA: Insufficient documentation

## 2024-02-14 NOTE — Patient Instructions (Signed)
 It was very nice to meet you today, as dicussed with will plan for the following :  1) upper endoscopy  2) Labwork and Stool sample  3) Ultrasound
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
## 2024-02-22 ENCOUNTER — Ambulatory Visit (HOSPITAL_COMMUNITY): Admission: RE | Admit: 2024-02-22 | Source: Ambulatory Visit

## 2024-02-23 ENCOUNTER — Telehealth: Payer: Self-pay | Admitting: *Deleted

## 2024-02-23 DIAGNOSIS — R1013 Epigastric pain: Secondary | ICD-10-CM

## 2024-02-23 NOTE — Telephone Encounter (Signed)
 Spoke with pt. She has been scheduled for EGD 9/8 with Dr. Cinderella. Aware will need urine preg test Friday. Order placed. Instructions sent to [facility]

## 2024-02-25 ENCOUNTER — Other Ambulatory Visit (HOSPITAL_COMMUNITY)
Admission: RE | Admit: 2024-02-25 | Discharge: 2024-02-25 | Disposition: A | Discharge status: Home / Self Care | Source: Ambulatory Visit | Attending: Gastroenterology | Admitting: Gastroenterology

## 2024-02-25 ENCOUNTER — Encounter (HOSPITAL_COMMUNITY)
Admission: RE | Admit: 2024-02-25 | Discharge: 2024-02-25 | Disposition: A | Discharge status: Home / Self Care | Source: Ambulatory Visit | Attending: Gastroenterology | Admitting: Gastroenterology

## 2024-02-25 DIAGNOSIS — Z01812 Encounter for preprocedural laboratory examination: Secondary | ICD-10-CM | POA: Diagnosis not present

## 2024-02-25 DIAGNOSIS — Z01818 Encounter for other preprocedural examination: Secondary | ICD-10-CM

## 2024-02-25 DIAGNOSIS — R1013 Epigastric pain: Secondary | ICD-10-CM

## 2024-02-25 DIAGNOSIS — Z3202 Encounter for pregnancy test, result negative: Secondary | ICD-10-CM | POA: Diagnosis present

## 2024-02-25 LAB — PREGNANCY, URINE: Preg Test, Ur: NEGATIVE

## 2024-02-25 NOTE — Addendum Note (Signed)
 Addended by: JEANELL GRAEME RAMAN on: 02/25/2024 11:37 AM   Modules accepted: Orders

## 2024-02-28 ENCOUNTER — Ambulatory Visit (HOSPITAL_COMMUNITY): Admission: RE | Admit: 2024-02-28 | Source: Home / Self Care | Admitting: Gastroenterology

## 2024-02-28 ENCOUNTER — Encounter (HOSPITAL_COMMUNITY): Admission: RE | Payer: Self-pay | Source: Home / Self Care

## 2024-02-28 DIAGNOSIS — Z01818 Encounter for other preprocedural examination: Secondary | ICD-10-CM

## 2024-02-28 SURGERY — EGD (ESOPHAGOGASTRODUODENOSCOPY)
Anesthesia: Choice

## 2024-03-31 ENCOUNTER — Other Ambulatory Visit: Payer: Self-pay | Admitting: Medical Genetics

## 2024-03-31 DIAGNOSIS — Z006 Encounter for examination for normal comparison and control in clinical research program: Secondary | ICD-10-CM

## 2024-04-03 ENCOUNTER — Ambulatory Visit (HOSPITAL_COMMUNITY): Attending: Gastroenterology

## 2024-04-18 LAB — GENECONNECT MOLECULAR SCREEN: Genetic Analysis Overall Interpretation: NEGATIVE

## 2024-05-16 ENCOUNTER — Encounter (INDEPENDENT_AMBULATORY_CARE_PROVIDER_SITE_OTHER): Payer: Self-pay

## 2024-05-16 ENCOUNTER — Encounter (INDEPENDENT_AMBULATORY_CARE_PROVIDER_SITE_OTHER): Payer: Self-pay | Admitting: Gastroenterology

## 2024-05-16 ENCOUNTER — Ambulatory Visit (INDEPENDENT_AMBULATORY_CARE_PROVIDER_SITE_OTHER): Admitting: Gastroenterology

## 2024-06-23 ENCOUNTER — Encounter: Payer: Self-pay | Admitting: Advanced Practice Midwife
